# Patient Record
Sex: Male | Born: 1975 | Race: White | Hispanic: No | Smoking: Former smoker
Health system: Southern US, Community
[De-identification: ages and names within clinical notes are randomized; demographics above are authoritative.]

## PROBLEM LIST (undated history)

## (undated) DIAGNOSIS — E162 Hypoglycemia, unspecified: Secondary | ICD-10-CM

## (undated) DIAGNOSIS — G8929 Other chronic pain: Secondary | ICD-10-CM

## (undated) DIAGNOSIS — M1990 Unspecified osteoarthritis, unspecified site: Secondary | ICD-10-CM

## (undated) DIAGNOSIS — J449 Chronic obstructive pulmonary disease, unspecified: Secondary | ICD-10-CM

## (undated) DIAGNOSIS — E119 Type 2 diabetes mellitus without complications: Secondary | ICD-10-CM

## (undated) DIAGNOSIS — I10 Essential (primary) hypertension: Secondary | ICD-10-CM

## (undated) DIAGNOSIS — G4730 Sleep apnea, unspecified: Secondary | ICD-10-CM

## (undated) DIAGNOSIS — F32A Depression, unspecified: Secondary | ICD-10-CM

## (undated) DIAGNOSIS — F329 Major depressive disorder, single episode, unspecified: Secondary | ICD-10-CM

## (undated) DIAGNOSIS — F419 Anxiety disorder, unspecified: Secondary | ICD-10-CM

## (undated) DIAGNOSIS — F17200 Nicotine dependence, unspecified, uncomplicated: Secondary | ICD-10-CM

## (undated) DIAGNOSIS — F1120 Opioid dependence, uncomplicated: Secondary | ICD-10-CM

## (undated) HISTORY — DX: Hypoglycemia, unspecified: E16.2

## (undated) HISTORY — DX: Essential (primary) hypertension: I10

## (undated) HISTORY — PX: BACK SURGERY: SHX140

## (undated) HISTORY — PX: EYE SURGERY: SHX253

---
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
## 2000-04-17 ENCOUNTER — Encounter: Admission: RE | Admit: 2000-04-17 | Discharge: 2000-07-16 | Payer: Self-pay | Admitting: Family Medicine

## 2003-05-22 ENCOUNTER — Encounter: Payer: Self-pay | Admitting: Family Medicine

## 2003-05-22 ENCOUNTER — Ambulatory Visit (HOSPITAL_COMMUNITY): Admission: RE | Admit: 2003-05-22 | Discharge: 2003-05-22 | Payer: Self-pay | Admitting: Family Medicine

## 2004-09-30 ENCOUNTER — Ambulatory Visit: Payer: Self-pay | Admitting: Family Medicine

## 2006-01-15 ENCOUNTER — Ambulatory Visit: Payer: Self-pay | Admitting: Family Medicine

## 2006-01-26 ENCOUNTER — Ambulatory Visit: Payer: Self-pay | Admitting: Family Medicine

## 2006-02-12 ENCOUNTER — Ambulatory Visit: Payer: Self-pay | Admitting: Family Medicine

## 2006-06-21 ENCOUNTER — Emergency Department (HOSPITAL_COMMUNITY): Admission: EM | Admit: 2006-06-21 | Discharge: 2006-06-21 | Payer: Self-pay | Admitting: Emergency Medicine

## 2006-06-26 ENCOUNTER — Encounter: Admission: RE | Admit: 2006-06-26 | Discharge: 2006-06-26 | Payer: Self-pay | Admitting: Occupational Medicine

## 2006-12-02 ENCOUNTER — Ambulatory Visit (HOSPITAL_COMMUNITY): Admission: RE | Admit: 2006-12-02 | Discharge: 2006-12-03 | Payer: Self-pay | Admitting: Orthopedic Surgery

## 2008-06-09 ENCOUNTER — Emergency Department (HOSPITAL_COMMUNITY): Admission: EM | Admit: 2008-06-09 | Discharge: 2008-06-09 | Payer: Self-pay | Admitting: Emergency Medicine

## 2008-10-13 ENCOUNTER — Emergency Department (HOSPITAL_COMMUNITY): Admission: EM | Admit: 2008-10-13 | Discharge: 2008-10-13 | Payer: Self-pay | Admitting: Emergency Medicine

## 2008-11-16 ENCOUNTER — Emergency Department (HOSPITAL_COMMUNITY): Admission: EM | Admit: 2008-11-16 | Discharge: 2008-11-16 | Payer: Self-pay | Admitting: Emergency Medicine

## 2008-12-07 ENCOUNTER — Emergency Department (HOSPITAL_COMMUNITY): Admission: EM | Admit: 2008-12-07 | Discharge: 2008-12-07 | Payer: Self-pay | Admitting: Emergency Medicine

## 2009-03-19 ENCOUNTER — Encounter
Admission: RE | Admit: 2009-03-19 | Discharge: 2009-03-19 | Payer: Self-pay | Admitting: Physical Medicine & Rehabilitation

## 2009-08-02 ENCOUNTER — Emergency Department (HOSPITAL_COMMUNITY): Admission: EM | Admit: 2009-08-02 | Discharge: 2009-08-02 | Payer: Self-pay | Admitting: Emergency Medicine

## 2010-06-24 ENCOUNTER — Ambulatory Visit (HOSPITAL_COMMUNITY): Admission: RE | Admit: 2010-06-24 | Discharge: 2010-06-24 | Payer: Self-pay | Admitting: Neurology

## 2010-12-01 ENCOUNTER — Encounter: Payer: Self-pay | Admitting: Family Medicine

## 2010-12-03 ENCOUNTER — Ambulatory Visit: Admit: 2010-12-03 | Payer: Self-pay | Admitting: Orthopedic Surgery

## 2010-12-03 ENCOUNTER — Encounter: Payer: Self-pay | Admitting: Orthopedic Surgery

## 2010-12-17 ENCOUNTER — Encounter: Payer: Self-pay | Admitting: Orthopedic Surgery

## 2010-12-18 ENCOUNTER — Encounter: Payer: Self-pay | Admitting: Orthopedic Surgery

## 2010-12-18 ENCOUNTER — Ambulatory Visit (INDEPENDENT_AMBULATORY_CARE_PROVIDER_SITE_OTHER): Payer: Medicare Other | Admitting: Orthopedic Surgery

## 2010-12-18 DIAGNOSIS — M7710 Lateral epicondylitis, unspecified elbow: Secondary | ICD-10-CM

## 2010-12-18 DIAGNOSIS — L600 Ingrowing nail: Secondary | ICD-10-CM | POA: Insufficient documentation

## 2010-12-18 DIAGNOSIS — M7580 Other shoulder lesions, unspecified shoulder: Secondary | ICD-10-CM

## 2010-12-18 DIAGNOSIS — M25519 Pain in unspecified shoulder: Secondary | ICD-10-CM | POA: Insufficient documentation

## 2010-12-19 ENCOUNTER — Encounter: Payer: Self-pay | Admitting: Orthopedic Surgery

## 2010-12-26 NOTE — Letter (Signed)
Summary: *Orthopedic Consult Note  [facility]  [address]  [place], [place] [address]   Phone: [phone]  Fax: [phone]    Re:    SAYF KERNER DOB:    [birth year]   Dear: Darleen Crocker   Thank you for requesting that we see the above patient for consultation.  A copy of the detailed office note will be sent under separate cover, for your review.  Evaluation today is consistent with: RIGHT shoulder pain from impingement syndrome, lateral epicondylitis RIGHT elbow.   Our recommendation is for: subacromial injection, exercise program for the shoulder and elbow       Thank you for this opportunity to look after your patient.  Sincerely,   Terrance Mass. MD.

## 2010-12-26 NOTE — Assessment & Plan Note (Signed)
Summary: rt shoulder pain radiat'g to elbow /needs xr [place]/mcr/c...   Vital Signs:  Patient profile:   35 year old male Height:      69 inches Weight:      225 pounds Pulse rate:   78 / minute Resp:     16 per minute  Vitals Entered By: Fuller Canada MD (December 18, 2010 3:09 PM)  Visit Type:  NEW PATIENT Referring Provider:  DR. Gerilyn Pilgrim Primary Provider:  [facility]  CC:  right shoulder pain.  History of Present Illness:   35 year old male with chronic pain seen for chronic pain management here today for evaluation of RIGHT shoulder and RIGHT elbow.  He complains of sharp throbbing burning RIGHT shoulder pain over the deltoid.  His pain is 7/10 in intensity and it is constant worse with any range of motion and associated with some numbness and tingling as well as loss of grip power.  He relates that to his chronic C5-C6 disc disease.    MEDS: PERCOCET 7.5/325MG , ROBAXIN 750MG , NEURONTIN 600MG , ZOLOFT 100MG .    Allergies (verified): 1)  ! Pcn  Past History:  Past Medical History: BACK PROBLEMS NECK PROBLEMS  Past Surgical History: BACK NOSE EYE  Family History: Family History of Diabetes Family History Coronary Heart Disease male < 86 Family History of Arthritis Hx, family, asthma  Social History: Patient is separated.  UNEMPLOYED SMOKES 1PPD NO ALCOHOL CAFFEINE USE DAILY 10TH GRADE ED  Review of Systems Constitutional:  Complains of weight gain; denies weight loss, fever, chills, and fatigue. Cardiovascular:  Complains of chest pain and palpitations; denies fainting and murmurs. Respiratory:  Complains of snoring; denies short of breath, wheezing, couch, tightness, pain on inspiration, and snoring . Gastrointestinal:  Complains of heartburn; denies nausea, vomiting, diarrhea, constipation, and blood in your stools. Genitourinary:  Complains of bleeding in urine; denies frequency, urgency, difficulty urinating, painful urination, and  flank pain. Neurologic:  Complains of numbness and tingling; denies unsteady gait, dizziness, tremors, and seizure. Musculoskeletal:  Complains of joint pain and stiffness; denies swelling, instability, redness, heat, and muscle pain. Endocrine:  Complains of heat or cold intolerance; denies excessive thirst and exessive urination. Psychiatric:  Complains of nervousness, depression, and anxiety; denies hallucinations. HEENT:  Complains of redness and watering; denies blurred or double vision and eye pain. Immunology:  Complains of seasonal allergies; denies sinus problems and allergic to bee stings.  The review of systems is negative for Skin and Hemoatologic.  Physical Exam  Skin:  intact without lesions or rashes Cervical Nodes:  no significant adenopathy Axillary Nodes:  no significant adenopathy Psych:  alert and cooperative; normal mood and affect; normal attention span and concentration Additional Exam:  RIGHT upper extremity examination  RIGHT elbow  He has tenderness over the lateral epicondyles no swelling.  He has normal range of motion.  He has normal flexion extension power and his elbow is stable.  He does have pain with wrist extension vs. resistance  RIGHT shoulder  There is no tenderness around the shoulder.  He has normal passive range of motion but painful passive range of motion in the arc of 120-180 .  He has a positive impingement sign and weakness of his supraspinatus tendon.  He shoulder remain stable.     Shoulder/Elbow Exam  General:    Well-developed, well-nourished, normal body habitus; no deformities, normal grooming.    Vascular:    Radial, ulnar, brachial, and axillary pulses 2+ and symmetric; capillary refill less than  2 seconds; no evidence of ischemia, clubbing, or cyanosis.    Sensory:    Gross sensation intact in the upper extremities.    Motor:      Otherwise normal RIGHT upper extremity.decreased R-supraspinatus.    Reflexes:     Normal reflexes in the upper extremities.     Impression & Recommendations:  Problem # 1:  SHOULDER PAIN (ICD-719.41) Assessment New x-rays of the shoulder were normal  We did give him a subacromial injection  He does not appear to need any surgery just in rehabilitation with an exercise program.  Problem # 2:  IMPINGEMENT SYNDROME (ICD-726.2) Assessment: New  Orders: New Patient Level III (08657) Shoulder x-ray,  minimum 2 views (84696) Joint Aspirate / Injection, Large (20610) Depo- Medrol 40mg  (J1030)  Problem # 3:  LATERAL EPICONDYLITIS (ICD-726.32) Assessment: New  recommend exercises and tennis elbow brace  Orders: New Patient Level III (29528)  Patient Instructions: 1)  TENNIS ELBOW  2)  ROTATOR CUFF SYNDROME  3)  You have received an injection of cortisone today. You may experience increased pain at the injection site. Apply ice pack to the area for 20 minutes every 2 hours and take 2 xtra strength tylenol every 8 hours. This increased pain will usually resolve in 24 hours. The injection will take effect in 3-10 days.  4)  Limit activity to comfort and avoid activities that increase discomfort.  Apply moist heat and/or ice to shoulder and take medication as instructed for pain relief. Please read the Shoulder Pain Handout and start Home exercises  as directed.   Orders Added: 1)  New Patient Level III [99203] 2)  Shoulder x-ray,  minimum 2 views [73030] 3)  Joint Aspirate / Injection, Large [20610] 4)  Depo- Medrol 40mg  [J1030]  Appended Document: rt shoulder pain radiat'g to elbow /needs xr [place]/mcr/c... Separate and Identifiable X-Ray report      AP lateral RIGHT shoulder for shoulder pain  Normal glenohumeral joint, type 1/2 acromion.  Impression normal shoulder

## 2011-01-01 NOTE — Medication Information (Signed)
Summary: Tax adviser   Imported By: Cammie Sickle 12/25/2010 12:52:07  _____________________________________________________________________  External Attachment:    Type:   Image     Comment:   External Document

## 2011-01-07 NOTE — Letter (Signed)
Summary: History form  History form   Imported By: Jacklynn Ganong 12/31/2010 14:00:45  _____________________________________________________________________  External Attachment:    Type:   Image     Comment:   External Document

## 2011-01-07 NOTE — Letter (Signed)
Summary: Referral notes  Referral notes   Imported By: Jacklynn Ganong 12/31/2010 14:01:24  _____________________________________________________________________  External Attachment:    Type:   Image     Comment:   External Document

## 2011-02-24 LAB — URINE MICROSCOPIC-ADD ON: Urine-Other: NONE SEEN

## 2011-02-24 LAB — CBC
Hemoglobin: 15.9 g/dL (ref 13.0–17.0)
MCHC: 33.9 g/dL (ref 30.0–36.0)
RBC: 5.33 MIL/uL (ref 4.22–5.81)

## 2011-02-24 LAB — COMPREHENSIVE METABOLIC PANEL
ALT: 42 U/L (ref 0–53)
Alkaline Phosphatase: 81 U/L (ref 39–117)
CO2: 27 mEq/L (ref 19–32)
Calcium: 9.1 mg/dL (ref 8.4–10.5)
GFR calc non Af Amer: >60 mL/min (ref >60)
Glucose, Bld: 101 mg/dL — ABNORMAL HIGH (ref 70–99)
Sodium: 136 mEq/L (ref 135–145)

## 2011-02-24 LAB — DIFFERENTIAL
Basophils Absolute: 0.1 10*3/uL (ref 0.0–0.1)
Basophils Relative: 1 % (ref 0–1)
Eosinophils Absolute: 0.4 10*3/uL (ref 0.0–0.7)
Lymphs Abs: 2.2 10*3/uL (ref 0.7–4.0)
Neutrophils Relative %: 62 % (ref 43–77)

## 2011-02-24 LAB — URINALYSIS, ROUTINE W REFLEX MICROSCOPIC
Leukocytes, UA: NEGATIVE
Nitrite: NEGATIVE
Protein, ur: NEGATIVE mg/dL
Urobilinogen, UA: 0.2 mg/dL (ref 0.0–1.0)

## 2011-03-28 NOTE — Op Note (Signed)
NAMEALEXIUS, [name]              ACCOUNT NO.:  [id number]   MEDICAL RECORD NO.:  [id number]          PATIENT TYPE:  AMB   LOCATION:  DAY                          FACILITY:  [facility]   PHYSICIAN:  Ronald A. Gioffre, M.D.DATE OF BIRTH:  12-04-1975   DATE OF PROCEDURE:  12/02/2006  DATE OF DISCHARGE:                               OPERATIVE REPORT   SURGEON:  Georges Lynch. Darrelyn Hillock, M.D.   ASSISTANT:  Jene Every, MD   PREOPERATIVE DIAGNOSIS:  Herniated lumbar disk L5-S1 central to the  left.   POSTOPERATIVE DIAGNOSIS:  Herniated lumbar disk L5-S1 central to the  left.   NOTE:  The patient had all left leg pain preop.   OPERATION:  1. Hemilaminectomy L5-S1 on the left.  2. Microdiskectomy L5-S1 left.  3. Foraminotomy L5-S1 on the left.   PROCEDURE:  Under general anesthesia, routine orthopedic prep and  draping of the back carried out.  The patient had 1 gram of IV Ancef  preop.  At this time two needles were placed in the back for  localization purposes after the sterile prep was carried out.  X-ray was  taken to verify our position.  Following that incision was made over the  L5-S1 interspace.  Another x-ray was taken to verify our exact position  at L5-S1 left.  Following that the incision was made over the L5-S1  interspace.  Bleeders identified and cauterized.  The muscle was  stripped from the lamina and spinous process at L5-S1 to identify the  sacrum.  Another x-ray was taken to verify our exact position.  I then  did a hemilaminectomy in usual fashion at L5-S1.  I removed a portion of  the ligamentum flavum out laterally into the lateral recess and then  cauterized lateral recess veins.  We gently retracted the dura and the  nerve with a D'Errico retractor.  We did a nice foraminotomy as well.  Following that, we identified the herniated disk.  Cruciate incision was  made the posterior longitudinal ligament and a diskectomy was carried  out.  We made multiple passes into  the space.  We utilized the Epstein  curettes to compress the disk down in the space and then remove the disk  material.  We explored the area above with a hockey-stick and it was  nice and free.  The root now and the dura now was easily movable and  free.  There were no other abnormalities in the canal.  I thoroughly  irrigated out the area, I loosely applied some thrombin-soaked Gelfoam  for hemostasis and the wound was closed in layers in usual fashion  except the deep proximal part of the wound was partially left open for  drainage purposes.  Sterile dry dressings were applied.  The patient had  30 mg of Toradol IV before leaving the operating room and prior to  surgery 1 gram of IV Ancef.           ______________________________  Georges Lynch. Darrelyn Hillock, M.D.    RAG/MEDQ  D:  12/02/2006  T:  12/02/2006  Job:  [id number]

## 2011-04-21 ENCOUNTER — Other Ambulatory Visit: Payer: Self-pay | Admitting: Neurology

## 2011-04-21 DIAGNOSIS — M5412 Radiculopathy, cervical region: Secondary | ICD-10-CM

## 2011-04-24 ENCOUNTER — Other Ambulatory Visit: Payer: Self-pay | Admitting: Neurology

## 2011-04-24 DIAGNOSIS — M5412 Radiculopathy, cervical region: Secondary | ICD-10-CM

## 2011-04-24 DIAGNOSIS — M75100 Unspecified rotator cuff tear or rupture of unspecified shoulder, not specified as traumatic: Secondary | ICD-10-CM

## 2011-04-25 ENCOUNTER — Ambulatory Visit (HOSPITAL_COMMUNITY): Payer: Medicare Other

## 2011-04-28 ENCOUNTER — Ambulatory Visit (HOSPITAL_COMMUNITY)
Admission: RE | Admit: 2011-04-28 | Discharge: 2011-04-28 | Disposition: A | Discharge status: Home / Self Care | Payer: Medicare Other | Source: Ambulatory Visit | Attending: Neurology | Admitting: Neurology

## 2011-04-28 DIAGNOSIS — M542 Cervicalgia: Secondary | ICD-10-CM | POA: Insufficient documentation

## 2011-04-28 DIAGNOSIS — M75100 Unspecified rotator cuff tear or rupture of unspecified shoulder, not specified as traumatic: Secondary | ICD-10-CM

## 2011-04-28 DIAGNOSIS — M5412 Radiculopathy, cervical region: Secondary | ICD-10-CM

## 2011-04-28 DIAGNOSIS — M25519 Pain in unspecified shoulder: Secondary | ICD-10-CM | POA: Insufficient documentation

## 2011-04-30 ENCOUNTER — Ambulatory Visit (INDEPENDENT_AMBULATORY_CARE_PROVIDER_SITE_OTHER): Payer: Medicare Other | Admitting: Orthopedic Surgery

## 2011-04-30 ENCOUNTER — Encounter: Payer: Self-pay | Admitting: Orthopedic Surgery

## 2011-04-30 DIAGNOSIS — M25519 Pain in unspecified shoulder: Secondary | ICD-10-CM

## 2011-04-30 DIAGNOSIS — M4802 Spinal stenosis, cervical region: Secondary | ICD-10-CM

## 2011-04-30 NOTE — Progress Notes (Signed)
   Followup visit  Patient went to ER for severe RIGHT shoulder pain  Patient had MRI cervical spine and MRI RIGHT shoulder on June 18  MRI of the shoulder shows no intra-articular abnormalities and a normal rotator cuff and biceps tendon  MRI of the cervical spine shows a multilevel foraminal stenosis secondary to spondylosis most significant at the C3-C4 and C6-C7 levels on the RIGHT with a small disc protrusion at C5-C6 and C6-C7.  Reexamination of the RIGHT shoulder reveals no impingement and no instability.  Recommend neurosurgical followup  Diagnosis spinal stenosis.  Cervical.

## 2011-04-30 NOTE — Patient Instructions (Signed)
Neurosurgery referral for C spine spinal stenosis

## 2011-05-02 ENCOUNTER — Telehealth: Payer: Self-pay | Admitting: Radiology

## 2011-05-02 NOTE — Telephone Encounter (Signed)
I faxed a referral for the patient to [facility] to be seen for spinal stenosis of his C-spine.

## 2011-05-06 ENCOUNTER — Telehealth: Payer: Self-pay | Admitting: Orthopedic Surgery

## 2011-05-06 NOTE — Telephone Encounter (Addendum)
I called patient at ph [phone] and relayed referral appointment information.

## 2011-05-06 NOTE — Telephone Encounter (Signed)
Received call from [facility] at [facility] for patient's referral appointment. Patient is scheduled for appointment Thursday, 05/22/11, 2:30 pm - arrival time: 2:00 pm; patient is to bring insurance card, photo ID, and a list of medications; states no need to bring MRI film, as they can pull it from [facility] system.

## 2011-06-26 ENCOUNTER — Encounter (HOSPITAL_COMMUNITY)
Admission: RE | Admit: 2011-06-26 | Discharge: 2011-06-26 | Disposition: A | Discharge status: Home / Self Care | Payer: Medicare Other | Source: Ambulatory Visit | Attending: Neurosurgery | Admitting: Neurosurgery

## 2011-06-26 ENCOUNTER — Ambulatory Visit (HOSPITAL_COMMUNITY)
Admission: RE | Admit: 2011-06-26 | Discharge: 2011-06-26 | Disposition: A | Discharge status: Home / Self Care | Payer: Medicare Other | Source: Ambulatory Visit | Attending: Neurosurgery | Admitting: Neurosurgery

## 2011-06-26 ENCOUNTER — Other Ambulatory Visit (HOSPITAL_COMMUNITY): Payer: Self-pay | Admitting: Neurosurgery

## 2011-06-26 DIAGNOSIS — Z01812 Encounter for preprocedural laboratory examination: Secondary | ICD-10-CM | POA: Insufficient documentation

## 2011-06-26 DIAGNOSIS — I10 Essential (primary) hypertension: Secondary | ICD-10-CM | POA: Insufficient documentation

## 2011-06-26 DIAGNOSIS — J45909 Unspecified asthma, uncomplicated: Secondary | ICD-10-CM | POA: Insufficient documentation

## 2011-06-26 DIAGNOSIS — M4722 Other spondylosis with radiculopathy, cervical region: Secondary | ICD-10-CM

## 2011-06-26 DIAGNOSIS — Z87891 Personal history of nicotine dependence: Secondary | ICD-10-CM | POA: Insufficient documentation

## 2011-06-26 DIAGNOSIS — Z01810 Encounter for preprocedural cardiovascular examination: Secondary | ICD-10-CM | POA: Insufficient documentation

## 2011-06-26 DIAGNOSIS — Z01818 Encounter for other preprocedural examination: Secondary | ICD-10-CM | POA: Insufficient documentation

## 2011-06-26 LAB — SURGICAL PCR SCREEN
MRSA, PCR: NEGATIVE
Staphylococcus aureus: NEGATIVE

## 2011-06-26 LAB — BASIC METABOLIC PANEL
Calcium: 9.7 mg/dL (ref 8.4–10.5)
Creatinine, Ser: 0.82 mg/dL (ref 0.50–1.35)
GFR calc non Af Amer: >60 mL/min (ref >60)
Glucose, Bld: 82 mg/dL (ref 70–99)
Sodium: 139 mEq/L (ref 135–145)

## 2011-06-26 LAB — CBC
MCH: 30.2 pg (ref 26.0–34.0)
MCHC: 35 g/dL (ref 30.0–36.0)
Platelets: 227 10*3/uL (ref 150–400)

## 2011-07-08 ENCOUNTER — Inpatient Hospital Stay (HOSPITAL_COMMUNITY)
Admission: RE | Admit: 2011-07-08 | Discharge: 2011-07-12 | DRG: 473 | Disposition: A | Discharge status: Home / Self Care | Payer: Medicare Other | Source: Ambulatory Visit | Attending: Neurosurgery | Admitting: Neurosurgery

## 2011-07-08 ENCOUNTER — Inpatient Hospital Stay (HOSPITAL_COMMUNITY): Payer: Medicare Other

## 2011-07-08 DIAGNOSIS — I10 Essential (primary) hypertension: Secondary | ICD-10-CM | POA: Diagnosis present

## 2011-07-08 DIAGNOSIS — J45909 Unspecified asthma, uncomplicated: Secondary | ICD-10-CM | POA: Diagnosis present

## 2011-07-08 DIAGNOSIS — M5030 Other cervical disc degeneration, unspecified cervical region: Principal | ICD-10-CM | POA: Diagnosis present

## 2011-07-08 DIAGNOSIS — F411 Generalized anxiety disorder: Secondary | ICD-10-CM | POA: Diagnosis present

## 2011-07-08 DIAGNOSIS — K219 Gastro-esophageal reflux disease without esophagitis: Secondary | ICD-10-CM | POA: Diagnosis present

## 2011-07-08 DIAGNOSIS — F17200 Nicotine dependence, unspecified, uncomplicated: Secondary | ICD-10-CM | POA: Diagnosis present

## 2011-07-09 NOTE — Op Note (Signed)
[name], Raymond NO.:  [id number]  MEDICAL RECORD NO.:  [id number]  LOCATION:  3106                         FACILITY:  [facility]  PHYSICIAN:  [name], M.D.   DATE OF BIRTH:  04/06/1976  DATE OF PROCEDURE:  07/08/2011 DATE OF DISCHARGE:  06/26/2011                              OPERATIVE REPORT   PREOPERATIVE DIAGNOSES:  Cervical stenosis C3-4, 4-5, 5-6, 6-7 with chronic radiculopathy, morbid obesity.  POSTOPERATIVE DIAGNOSES:  Cervical stenosis C3-4, 4-5, 5-6, 6-7 with chronic radiculopathy, morbid obesity.  PROCEDURE:  Anterior C3-4, 4-5, 5-6, 6-7 diskectomy, decompression of spinal cord, bilateral foraminotomy, interbody fusion with allograft, 6 mm _lordoticpost ligament_________ at the level of C3-C4 and 7 mm lordotic at the other 3 levels, microscope.  SURGEON:  Raymond Lias, MD  ASSISTANT:  [name], M.D.  CLINICAL HISTORY:  [name] is a 35 year old gentleman, unemployed, who had surgery DONE IN THE__________lower back for many years.  Now, he comes complaining for weakness, neck pain, and x-ray shows stenosis from C3 down to C6-7.  The patient is quite obese.  The patient knows the risk of the surgery including that the surgery will not correct his chronic lumbar problem.  PROCEDURE:  The patient was taken to the OR and after intubation, the left side of the neck was cleaned with DuraPrep.  Drapes were applied. The patient had quite a bit of short neck.  He was difficult to feel any anatomy.  Nevertheless, after the DuraPrep was tried, a longitudinal incision was made through the skin subcutaneous tissue.  Dissection was carried out, all the way down to the cervical spine. The patient had quite a bit of fat tissue, and it was difficult to keep the retraction to visualize the cervical spine.  We had to use a cerebellar retractor just to keep the skin folds away from the surgical procedure.  X-rays showed that we were at  the level of C3-C4, C4-C5.  Then, the anterior osteophyte was removed across 2 levels as well at C5-6 and 6-7.  We brought the microscope and we debrided with drilling of posterior ligament__________ of C3- 4.  The patient has quite bit of degenerative disk disease with stenosis.  The posterior ligament was shortened and decompression of the spinal cord was achieved as well as the both C4 nerve roots.  At the level of C4-C5, he has quite a bit of calcification of the posterior ligament.  We have to drill the posterior ligament just to be able to get into the dura mater.  At the end the dura mater was decompressed with plenty__________ of the C5 nerve root.  The same procedure was done at the level of C5-6, 6-7.  At the end, we have good decompression of the spinal cord, as well as the C4-C5, C6-C7 nerve root.  The endplate was drilled.  An allograft of 6 mm lordotic was inset at the level of C3-C4 and the other 3 levels was 7 mm.  This was followed by plate using 10 screws.  Although, we achieved good hemostasis, we used Surgifoam as well as we left drain in the precervical area.  From then the wound was closed and  Steri-Strips were used for skin.  Vicryl was used for soft tissue.  The patient was stable, because of his obesity problem, he has sleep apnea.  He is going to go to the Intensive Care Unit.          ______________________________ [name], M.D.     EB/MEDQ  D:  07/08/2011  T:  07/09/2011  Job:  [id number]  Electronically Signed by [name] M.D. on 07/09/2011 05:05:37 PM

## 2011-07-11 NOTE — H&P (Signed)
  [name], [name]              ACCOUNT NO.:  [id number]  MEDICAL RECORD NO.:  [id number]  LOCATION:  3106                         FACILITY:  [facility]  PHYSICIAN:  Hilda Lias, M.D.   DATE OF BIRTH:  July 04, 1976  DATE OF ADMISSION:  07/08/2011 DATE OF DISCHARGE:                             HISTORY & PHYSICAL   [name] is a gentleman who has been seen in my office  because of neck pain with radiation to both upper extremities associated with weakness.  He feels every time he moves the pain gets worse.  The pain is also associated with a throbbing sensation.  PAST MEDICAL HISTORY:  Lumbar discectomy with Orthopedic surgeon.  He has had chronic back pain, followed by a Pain Clinic.  ALLERGIES:  He tells me he is allergic to PENICILLIN and ERYTHROMYCIN.  At the present time, he is taking oxycodone metoprolol and  Zoloft.  FAMILY HISTORY:  Mother has heart disease and she is 18.  Father has diabetes and high blood pressure.  SOCIAL HISTORY:  He does not drink.  He does smoke a pack of cigarettes a day.  PHYSICAL EXAMINATION:  neck  decrease of mobility secondary to pain lungs  bilateral ronchis cvs normal   abdomen normal     ext normal neuro  weakness of deltouid bicepes decreaase of reflexes. burning sensation in arms.mri stenosis cervial c3 c7 impresion  cervical stenosis c3 to c7 EB/MEDQ  D:  07/08/2011  T:  07/08/2011  Job:  [id number]  Electronically Signed by Hilda Lias M.D. on 07/11/2011 06:29:35 PM

## 2011-08-15 LAB — POCT CARDIAC MARKERS
CKMB, poc: <1 ng/mL — ABNORMAL LOW (ref 1.0–8.0)
Troponin i, poc: <0.05 ng/mL (ref 0.00–0.09)

## 2011-08-15 LAB — BASIC METABOLIC PANEL
BUN: 14 mg/dL (ref 6–23)
Chloride: 105 mEq/L (ref 96–112)
Creatinine, Ser: 0.75 mg/dL (ref 0.4–1.5)
Glucose, Bld: 81 mg/dL (ref 70–99)

## 2011-08-15 LAB — CBC
MCV: 88.2 fL (ref 78.0–100.0)
Platelets: 207 10*3/uL (ref 150–400)
RDW: 12.7 % (ref 11.5–15.5)
WBC: 11.7 10*3/uL — ABNORMAL HIGH (ref 4.0–10.5)

## 2011-08-15 LAB — DIFFERENTIAL
Basophils Absolute: 0.1 10*3/uL (ref 0.0–0.1)
Basophils Relative: 1 % (ref 0–1)
Eosinophils Absolute: 0.4 10*3/uL (ref 0.0–0.7)
Eosinophils Relative: 4 % (ref 0–5)
Lymphs Abs: 1.4 10*3/uL (ref 0.7–4.0)
Neutrophils Relative %: 74 % (ref 43–77)

## 2011-08-15 NOTE — Discharge Summary (Signed)
  NAMETHUNDER, BRIDGEWATER              ACCOUNT NO.:  [id number]  MEDICAL RECORD NO.:  [id number]  LOCATION:  3037                         FACILITY:  [facility]  PHYSICIAN:  Hilda Lias, M.D.   DATE OF BIRTH:  1976-08-16  DATE OF ADMISSION:  07/08/2011 DATE OF DISCHARGE:  07/12/2011                              DISCHARGE SUMMARY   ADMISSION DIAGNOSES:  Cervical stenosis with spondylosis and radiculopathy, cervical 3-4, cervical 4-5, 5-6, 6-7.  FINAL DIAGNOSES:  Cervical stenosis with spondylosis and radiculopathy, cervical 3-4, cervical 4-5, 5-6, 6-7.  CLINICAL HISTORY:  [name] was admitted because of neck pain with radiation to both upper extremity associated with weakness.  X-ray shows stenosis from C3 down to C7.  Surgery was advised.  Laboratory normal.  COURSE IN THE HOSPITAL:  The patient was taken to surgery on July 08, 2011, and decompression from cervical 3-7 was done.  The patient did well, complains of neck pain.  Eventually the pain got better with medication.  He was discharged by Dr. Marikay Alar on July 12, 2011.  CONDITION ON DISCHARGE:  Improvement.  MEDICATION:  Percocet, diazepam.  DIET:  Regular.  ACTIVITY:  Not to drive for at least a week.  FOLLOWUP:  He was told to be seen by me in 2 to 3 weeks.          ______________________________ Hilda Lias, M.D.     EB/MEDQ  D:  07/23/2011  T:  07/24/2011  Job:  [id number]  Electronically Signed by Hilda Lias M.D. on 08/15/2011 06:13:10 PM

## 2011-12-16 ENCOUNTER — Other Ambulatory Visit: Payer: Self-pay | Admitting: Anesthesiology

## 2011-12-16 DIAGNOSIS — M545 Low back pain: Secondary | ICD-10-CM

## 2011-12-23 ENCOUNTER — Inpatient Hospital Stay: Admission: RE | Admit: 2011-12-23 | Payer: Medicare Other | Source: Ambulatory Visit

## 2011-12-30 ENCOUNTER — Inpatient Hospital Stay: Admission: RE | Admit: 2011-12-30 | Payer: Medicare Other | Source: Ambulatory Visit

## 2012-02-25 ENCOUNTER — Ambulatory Visit
Admission: RE | Admit: 2012-02-25 | Discharge: 2012-02-25 | Disposition: A | Discharge status: Home / Self Care | Payer: Medicare Other | Source: Ambulatory Visit | Attending: Anesthesiology | Admitting: Anesthesiology

## 2012-02-25 DIAGNOSIS — M545 Low back pain: Secondary | ICD-10-CM

## 2013-05-19 DIAGNOSIS — M1990 Unspecified osteoarthritis, unspecified site: Secondary | ICD-10-CM | POA: Insufficient documentation

## 2013-10-31 DIAGNOSIS — I10 Essential (primary) hypertension: Secondary | ICD-10-CM | POA: Insufficient documentation

## 2013-10-31 DIAGNOSIS — M5417 Radiculopathy, lumbosacral region: Secondary | ICD-10-CM | POA: Insufficient documentation

## 2013-10-31 DIAGNOSIS — R252 Cramp and spasm: Secondary | ICD-10-CM | POA: Insufficient documentation

## 2013-10-31 DIAGNOSIS — M25559 Pain in unspecified hip: Secondary | ICD-10-CM | POA: Insufficient documentation

## 2013-10-31 DIAGNOSIS — M129 Arthropathy, unspecified: Secondary | ICD-10-CM | POA: Insufficient documentation

## 2014-01-31 DIAGNOSIS — M5412 Radiculopathy, cervical region: Secondary | ICD-10-CM | POA: Insufficient documentation

## 2014-04-17 DIAGNOSIS — M792 Neuralgia and neuritis, unspecified: Secondary | ICD-10-CM | POA: Insufficient documentation

## 2014-04-17 DIAGNOSIS — M47812 Spondylosis without myelopathy or radiculopathy, cervical region: Secondary | ICD-10-CM | POA: Insufficient documentation

## 2014-04-17 DIAGNOSIS — M5412 Radiculopathy, cervical region: Secondary | ICD-10-CM | POA: Insufficient documentation

## 2014-04-17 DIAGNOSIS — M5416 Radiculopathy, lumbar region: Secondary | ICD-10-CM | POA: Diagnosis present

## 2014-07-21 DIAGNOSIS — M5412 Radiculopathy, cervical region: Secondary | ICD-10-CM | POA: Insufficient documentation

## 2014-10-12 DIAGNOSIS — M5412 Radiculopathy, cervical region: Secondary | ICD-10-CM | POA: Insufficient documentation

## 2014-10-12 DIAGNOSIS — M542 Cervicalgia: Secondary | ICD-10-CM | POA: Insufficient documentation

## 2014-11-08 ENCOUNTER — Emergency Department (HOSPITAL_COMMUNITY): Payer: Medicare Other

## 2014-11-08 ENCOUNTER — Encounter (HOSPITAL_COMMUNITY): Payer: Self-pay | Admitting: Emergency Medicine

## 2014-11-08 ENCOUNTER — Emergency Department (HOSPITAL_COMMUNITY)
Admission: EM | Admit: 2014-11-08 | Discharge: 2014-11-08 | Disposition: A | Discharge status: Home / Self Care | Payer: Medicare Other | Attending: Emergency Medicine | Admitting: Emergency Medicine

## 2014-11-08 DIAGNOSIS — F419 Anxiety disorder, unspecified: Secondary | ICD-10-CM | POA: Insufficient documentation

## 2014-11-08 DIAGNOSIS — Z880 Allergy status to penicillin: Secondary | ICD-10-CM | POA: Insufficient documentation

## 2014-11-08 DIAGNOSIS — Z8639 Personal history of other endocrine, nutritional and metabolic disease: Secondary | ICD-10-CM | POA: Insufficient documentation

## 2014-11-08 DIAGNOSIS — R079 Chest pain, unspecified: Secondary | ICD-10-CM

## 2014-11-08 DIAGNOSIS — F985 Adult onset fluency disorder: Secondary | ICD-10-CM | POA: Diagnosis not present

## 2014-11-08 DIAGNOSIS — Z720 Tobacco use: Secondary | ICD-10-CM | POA: Diagnosis not present

## 2014-11-08 DIAGNOSIS — F8081 Childhood onset fluency disorder: Secondary | ICD-10-CM

## 2014-11-08 DIAGNOSIS — R479 Unspecified speech disturbances: Secondary | ICD-10-CM | POA: Diagnosis present

## 2014-11-08 DIAGNOSIS — Z79899 Other long term (current) drug therapy: Secondary | ICD-10-CM | POA: Insufficient documentation

## 2014-11-08 DIAGNOSIS — I10 Essential (primary) hypertension: Secondary | ICD-10-CM | POA: Diagnosis not present

## 2014-11-08 LAB — CBC WITH DIFFERENTIAL/PLATELET
Basophils Absolute: 0 10*3/uL (ref 0.0–0.1)
Basophils Relative: 1 % (ref 0–1)
EOS ABS: 0.2 10*3/uL (ref 0.0–0.7)
EOS PCT: 3 % (ref 0–5)
HEMATOCRIT: 41.4 % (ref 39.0–52.0)
HEMOGLOBIN: 14.1 g/dL (ref 13.0–17.0)
LYMPHS ABS: 1.6 10*3/uL (ref 0.7–4.0)
Lymphocytes Relative: 22 % (ref 12–46)
MCH: 29.6 pg (ref 26.0–34.0)
MCHC: 34.1 g/dL (ref 30.0–36.0)
MCV: 86.8 fL (ref 78.0–100.0)
MONO ABS: 0.8 10*3/uL (ref 0.1–1.0)
MONOS PCT: 11 % (ref 3–12)
Neutro Abs: 4.6 10*3/uL (ref 1.7–7.7)
Neutrophils Relative %: 63 % (ref 43–77)
Platelets: 228 10*3/uL (ref 150–400)
RBC: 4.77 MIL/uL (ref 4.22–5.81)
RDW: 12.5 % (ref 11.5–15.5)
WBC: 7.3 10*3/uL (ref 4.0–10.5)

## 2014-11-08 LAB — BASIC METABOLIC PANEL
Anion gap: 8 (ref 5–15)
BUN: 9 mg/dL (ref 6–23)
CHLORIDE: 105 meq/L (ref 96–112)
CO2: 23 mmol/L (ref 19–32)
CREATININE: 0.65 mg/dL (ref 0.50–1.35)
Calcium: 8.8 mg/dL (ref 8.4–10.5)
GFR calc Af Amer: >90 mL/min (ref >90)
GFR calc non Af Amer: >90 mL/min (ref >90)
Glucose, Bld: 93 mg/dL (ref 70–99)
Potassium: 3.7 mmol/L (ref 3.5–5.1)
Sodium: 136 mmol/L (ref 135–145)

## 2014-11-08 LAB — PROTIME-INR
INR: 1.02 (ref 0.00–1.49)
Prothrombin Time: 13.5 seconds (ref 11.6–15.2)

## 2014-11-08 LAB — TROPONIN I: Troponin I: <0.03 ng/mL (ref <0.031)

## 2014-11-08 MED ORDER — LORAZEPAM 1 MG PO TABS
1.0000 mg | ORAL_TABLET | Freq: Once | ORAL | Status: AC
  Administered 2014-11-08: 1 mg via ORAL
  Filled 2014-11-08: qty 1

## 2014-11-08 NOTE — ED Notes (Signed)
Pt ambulated around the unit with minimal assistance.

## 2014-11-08 NOTE — ED Notes (Signed)
Per EMS, pt comes from home with abnormal speech. Pt was last seen normal at 1743. Pt had gotten upset and sister noticed his speech changed at 1743. Pt A&OX4, NAD noted. Pt fell in shower and did not hit head. Initial BP 202/116, P86.

## 2014-11-08 NOTE — ED Notes (Signed)
Pt's speech clear.

## 2014-11-08 NOTE — Discharge Instructions (Signed)
Read the information below.  You may return to the Emergency Department at any time for worsening condition or any new symptoms that concern you.   If you develop worsening chest pain, shortness of breath, fever, you pass out, or become weak or dizzy, return to the ER for a recheck.    °

## 2014-11-08 NOTE — Consult Note (Signed)
Reason for Consult: New onset speech abnormality.  HPI:                                                                                                                                          [name] is an 38 y.o. male with a history of hypertension, hyperglycemia, chronic pain syndrome, anxiety and depression, presenting with new onset stuttering type speech abnormality. Onset was at 5:45 PM today and was associated with getting extremely angry with his daughter. Vision has had no difficulty with writing what he wants to say. He said no focal weakness. He complains of slight numbness involving right side of his face. CT scan of his head showed no acute intracranial abnormality. A pressure was elevated at 202/116 on arrival. NIH stroke or was 2 for speech flow abnormality and focal numbness involving right side of his face. Code stroke was activated but was subsequently canceled, as patient's findings were not considered psychophysiologic in etiology.  Past Medical History  Diagnosis Date  . High blood pressure   . Hypoglycemia     Past Surgical History  Procedure Laterality Date  . Back surgery    . Eye surgery      History reviewed. No pertinent family history.  Social History:  reports that he has been smoking Cigarettes.  He has been smoking about 2.00 packs per day. He does not have any smokeless tobacco history on file. He reports that he does not drink alcohol. His drug history is not on file.  Allergies  Allergen Reactions  . Penicillins     MEDICATIONS:                                                                                                                     I have reviewed the patient's current medications.   ROS:  History obtained from the patient  General ROS: negative for - chills, fatigue, fever, night sweats, weight  gain or weight loss Psychological ROS: negative for - behavioral disorder, hallucinations, memory difficulties, mood swings or suicidal ideation Ophthalmic ROS: negative for - blurry vision, double vision, eye pain or loss of vision ENT ROS: negative for - epistaxis, nasal discharge, oral lesions, sore throat, tinnitus or vertigo Allergy and Immunology ROS: negative for - hives or itchy/watery eyes Hematological and Lymphatic ROS: negative for - bleeding problems, bruising or swollen lymph nodes Endocrine ROS: negative for - galactorrhea, hair pattern changes, polydipsia/polyuria or temperature intolerance Respiratory ROS: negative for - cough, hemoptysis, shortness of breath or wheezing Cardiovascular ROS: negative for - chest pain, dyspnea on exertion, edema or irregular heartbeat Gastrointestinal ROS: negative for - abdominal pain, diarrhea, hematemesis, nausea/vomiting or stool incontinence Genito-Urinary ROS: negative for - dysuria, hematuria, incontinence or urinary frequency/urgency Musculoskeletal ROS: negative for - joint swelling or muscular weakness Neurological ROS: as noted in HPI Dermatological ROS: negative for rash and skin lesion changes   Blood pressure 135/73, pulse 77, temperature 98.5 F (36.9 C), temperature source Oral, resp. rate 20, height 5\' 9"  (1.753 m), weight 114.306 kg (252 lb), SpO2 99 %. Urines examined obese early middle-aged appearing man who was alert and in no acute distress. HEENT: Unremarkable Neck was supple with full range of motion. Extremities were normal in appearance.  Neurologic Examination:                                                                                                      Mental Status: Alert, oriented, thought content appropriate.  Inconsistent stuttering of speech without evidence of aphasia. Patient was able to write without difficulty. Able to follow commands without difficulty. Cranial Nerves: II-Visual fields were  normal. III/IV/VI-Pupils were equal and reacted. Extraocular movements were full and conjugate.    V/VII-slight right facial numbness compared to left side; no facial weakness. VIII-normal. X-no dysarthria; symmetrical palatal movement. Motor: 5/5 bilaterally with normal tone and bulk Sensory: Normal throughout. Deep Tendon Reflexes: 2+ and symmetric. Plantars: Flexor bilaterally Cerebellar: Normal finger-to-nose testing. Carotid auscultation: Normal  No results found for: CHOL  Results for orders placed or performed during the hospital encounter of 11/08/14 (from the past 48 hour(s))  CBC with Differential     Status: None   Collection Time: 11/08/14  7:51 PM  Result Value Ref Range   WBC 7.3 4.0 - 10.5 K/uL   RBC 4.77 4.22 - 5.81 MIL/uL   Hemoglobin 14.1 13.0 - 17.0 g/dL   HCT 41.4 39.0 - 52.0 %   MCV 86.8 78.0 - 100.0 fL   MCH 29.6 26.0 - 34.0 pg   MCHC 34.1 30.0 - 36.0 g/dL   RDW 12.5 11.5 - 15.5 %   Platelets 228 150 - 400 K/uL   Neutrophils Relative % 63 43 - 77 %   Neutro Abs 4.6 1.7 - 7.7 K/uL   Lymphocytes Relative 22 12 - 46 %   Lymphs Abs 1.6 0.7 - 4.0 K/uL   Monocytes Relative 11  3 - 12 %   Monocytes Absolute 0.8 0.1 - 1.0 K/uL   Eosinophils Relative 3 0 - 5 %   Eosinophils Absolute 0.2 0.0 - 0.7 K/uL   Basophils Relative 1 0 - 1 %   Basophils Absolute 0.0 0.0 - 0.1 K/uL  Protime-INR     Status: None   Collection Time: 11/08/14  7:51 PM  Result Value Ref Range   Prothrombin Time 13.5 11.6 - 15.2 seconds   INR 1.02 0.00 - 1.49    No results found.   Assessment/Plan: 38 year old man is entering with new onset stuttering speech pattern associated with acute agitation and stress. Pattern of speech abnormality is not consistent with expressive aphasia, and is is likely psychophysiologic in origin. Examination showed no objective abnormalities and CT scan of his head was unremarkable.  No further neurological intervention is indicated, including no further  neurodiagnostic studies. Hypertension to be managed by ED physician.  C.R. Nicole Kindred, MD [facility] [phone]  11/08/2014, 8:36 PM

## 2014-11-08 NOTE — ED Provider Notes (Signed)
CSN: [id number]     Arrival date & time 11/08/14  1906 History   First MD Initiated Contact with Patient 11/08/14 1911     Chief Complaint  Patient presents with  . abnormal speech      (Consider location/radiation/quality/duration/timing/severity/associated sxs/prior Treatment) The history is provided by the patient.     Patient with hx HTN, anxiety presents with stuttering speech that began around 1745 today.  States he had had a mild typical headache, frontal throbbing, mild around 1530 today.  His daughter then turned on some music and this made him very angry, 9/10 intensity angry.  He then attempted to talk on the phone with his friend and developed stuttering speech.  He is having trouble getting his words out, though he can think of what he wants to say and can understand what is said to him.  He is finding this very frustrating.  He also notes some central chest discomfort that feels like heartburn.   Past Medical History  Diagnosis Date  . High blood pressure   . Hypoglycemia    Past Surgical History  Procedure Laterality Date  . Back surgery    . Eye surgery     History reviewed. No pertinent family history. History  Substance Use Topics  . Smoking status: Current Every Day Smoker -- 2.00 packs/day    Types: Cigarettes  . Smokeless tobacco: Not on file  . Alcohol Use: No    Review of Systems  All other systems reviewed and are negative.     Allergies  Penicillins  Home Medications   Prior to Admission medications   Medication Sig Start Date End Date Taking? Authorizing Provider  cloNIDine (CATAPRES) 0.1 MG tablet Take 0.1 mg by mouth 3 (three) times daily.      Historical Provider, MD  gabapentin (NEURONTIN) 300 MG capsule Take 600 mg by mouth 3 (three) times daily.      Historical Provider, MD  lisinopril (PRINIVIL,ZESTRIL) 10 MG tablet Take 10 mg by mouth daily.      Historical Provider, MD  methocarbamol (ROBAXIN) 750 MG tablet Take 750 mg by mouth 3  (three) times daily.      Historical Provider, MD  oxyCODONE-acetaminophen (PERCOCET) 7.5-325 MG per tablet Take 1 tablet by mouth every 4 (four) hours as needed.      Historical Provider, MD  sertraline (ZOLOFT) 100 MG tablet Take 100 mg by mouth daily.      Historical Provider, MD  topiramate (TOPAMAX) 50 MG tablet Take 50 mg by mouth 2 (two) times daily.      Historical Provider, MD   BP 157/89 mmHg  Pulse 82  Temp(Src) 98.5 F (36.9 C) (Oral)  Resp 20  Ht 5\' 9"  (1.753 m)  Wt 252 lb (114.306 kg)  BMI 37.20 kg/m2  SpO2 95% Physical Exam  Constitutional: He appears well-developed and well-nourished. No distress.  HENT:  Head: Normocephalic and atraumatic.  Neck: Neck supple.  Cardiovascular: Normal rate and regular rhythm.   Pulmonary/Chest: Effort normal and breath sounds normal. No respiratory distress. He has no wheezes. He has no rales.  Abdominal: Soft. He exhibits no distension and no mass. There is no tenderness. There is no rebound and no guarding.  Neurological: He is alert. He exhibits normal muscle tone.  Stuttering speech.    CN II-XII intact with exception of abnormal sensation to right face, no pronator drift, grip strengths equal bilaterally; strength 5/5 in all extremities, sensation intact in all extremities; finger to nose,  heel to shin, rapid alternating movements normal.   Skin: He is not diaphoretic.  Nursing note and vitals reviewed.   ED Course  Procedures (including critical care time) Labs Review Labs Reviewed  CBC WITH DIFFERENTIAL  PROTIME-INR  BASIC METABOLIC PANEL  TROPONIN I    Imaging Review Dg Chest 1 View  11/08/2014   CLINICAL DATA:  38 year old male with history of chest pain and shortness of breath.  EXAM: CHEST - 1 VIEW  COMPARISON:  Chest x-ray 04/18/2013.  FINDINGS: Lung volumes are low. No consolidative airspace disease. No pleural effusions. No pneumothorax. No pulmonary nodule or mass noted. Pulmonary vasculature and the  cardiomediastinal silhouette are within normal limits. Orthopedic fixation hardware in the lower cervical spine incidentally noted.  IMPRESSION: No radiographic evidence of acute cardiopulmonary disease.   Electronically Signed   By: Vinnie Langton M.D.   On: 11/08/2014 20:50   Ct Head Wo Contrast  11/08/2014   CLINICAL DATA:  Code stroke.  Stuttering.  EXAM: CT HEAD WITHOUT CONTRAST  TECHNIQUE: Contiguous axial images were obtained from the base of the skull through the vertex without intravenous contrast.  COMPARISON:  None available.  FINDINGS: There is no acute intracranial hemorrhage or infarct. No mass lesion or midline shift. Gray-white matter differentiation is well maintained. Ventricles are normal in size without evidence of hydrocephalus. CSF containing spaces are within normal limits. No extra-axial fluid collection.  The calvarium is intact.  Orbital soft tissues are within normal limits.  Moderate polypoid mucosal thickening present within the maxillary sinuses bilaterally, left greater than right. No air-fluid levels identified. Mastoid air cells are hypoplastic and partially opacified bilaterally. Tympanic spaces are clear.  Scalp soft tissues are unremarkable.  IMPRESSION: 1. No acute intracranial process. 2. Moderate bilateral maxillary sinus disease.  Critical Value/emergent results were called by telephone at the time of interpretation on 11/08/2014 at 8:36 pm to Dr. Nicole Kindred, who verbally acknowledged these results.   Electronically Signed   By: Jeannine Boga M.D.   On: 11/08/2014 20:41     EKG Interpretation   Date/Time:  Wednesday November 08 2014 19:15:41 EST Ventricular Rate:  82 PR Interval:  166 QRS Duration: 127 QT Interval:  392 QTC Calculation: 458 R Axis:   -56 Text Interpretation:  Sinus rhythm RBBB and LAFB Confirmed by Hazle Coca  [phone]) on 11/08/2014 8:40:18 PM       7:44 PM Dr Ralene Bathe made aware of and will see patient.    Dr Ralene Bathe advises calling code  stroke.  Code stroke order placed.    8:12 PM Dr Nicole Kindred has seen and evaluated patient.    Patient speaking normally after ativan.    Provided smoking cessation counseling.   MDM   Final diagnoses:  Stuttering  Anxiety    Afebrile, nontoxic patient with stuttering speech that began after becoming angry at his daughter.  He has a history of anxiety.  Neuro was consulted and code stroke activated given the timeframe and concern for expressive aphasia.  Seen by Dr Nicole Kindred and code stroke cancelled.  CT negative.  Workup negative.  Symptoms completely resolved with ativan.  Pt did report some symptoms of heartburn, he states he has this frequently and that it is very mild and no different than his chronic symptoms.  It was atypical and I doubt ACS.   D/C home with close PCP follow up.  Pt has xanax at home.  Discussed result, findings, treatment, and follow up  with patient.  Pt given  return precautions.  Pt verbalizes understanding and agrees with plan.        Clayton Bibles, PA-C 11/08/14 [id number]  Quintella Reichert, MD 11/09/14 Dyann Kief

## 2014-11-08 NOTE — ED Notes (Signed)
Code stroke called @ [phone]

## 2014-11-08 NOTE — ED Notes (Signed)
EDP at bedside  

## 2014-11-11 ENCOUNTER — Encounter (HOSPITAL_COMMUNITY): Payer: Self-pay | Admitting: *Deleted

## 2014-11-11 ENCOUNTER — Emergency Department (HOSPITAL_COMMUNITY)
Admission: EM | Admit: 2014-11-11 | Discharge: 2014-11-11 | Disposition: A | Discharge status: Home / Self Care | Payer: Medicare Other | Attending: Emergency Medicine | Admitting: Emergency Medicine

## 2014-11-11 DIAGNOSIS — F419 Anxiety disorder, unspecified: Secondary | ICD-10-CM | POA: Diagnosis not present

## 2014-11-11 DIAGNOSIS — Z720 Tobacco use: Secondary | ICD-10-CM | POA: Insufficient documentation

## 2014-11-11 DIAGNOSIS — Z8639 Personal history of other endocrine, nutritional and metabolic disease: Secondary | ICD-10-CM | POA: Diagnosis not present

## 2014-11-11 DIAGNOSIS — Z79899 Other long term (current) drug therapy: Secondary | ICD-10-CM | POA: Insufficient documentation

## 2014-11-11 DIAGNOSIS — I10 Essential (primary) hypertension: Secondary | ICD-10-CM | POA: Diagnosis not present

## 2014-11-11 DIAGNOSIS — Z880 Allergy status to penicillin: Secondary | ICD-10-CM | POA: Diagnosis not present

## 2014-11-11 NOTE — ED Provider Notes (Signed)
CSN: [id number]     Arrival date & time 11/11/14  1959 History   First MD Initiated Contact with Patient 11/11/14 2022     Chief Complaint  Patient presents with  . Anxiety  . Hypertension     (Consider location/radiation/quality/duration/timing/severity/associated sxs/prior Treatment) HPI   39 year old male with history of hypertension presents for complaint of chest pain anxiety.  Patient was seen in the ED 2 days ago for essentially the same complaint. At that time he was having some slurring of speech and a code stroke was called however after evaluated by the neurologist, Dr. Nicole Kindred the code stroke was canceled. Patient's workup was unremarkable with a negative head CT. It was thought that his symptoms is likely related to anxiety. Today patient has an argument with his daughter, this upset him greatly. He then check his blood pressure and states that it was in the 180s. His daughter was concerned and called EMS which brought him to the ER. Patient states since being in the ER, he is feeling much better and no longer anxious. He denies having any significant lightheadedness or dizziness, chest pain, short of breath, diaphoresis, nausea or vomiting. He did take Clonidine prior to arrival. He admits that he has a history of anxiety in the past. He admits that he only take his clonidine when his blood pressure is high per recommendation of his physician. Patient is now at his baseline.  Past Medical History  Diagnosis Date  . High blood pressure   . Hypoglycemia    Past Surgical History  Procedure Laterality Date  . Back surgery    . Eye surgery     No family history on file. History  Substance Use Topics  . Smoking status: Current Every Day Smoker -- 2.00 packs/day    Types: Cigarettes  . Smokeless tobacco: Not on file  . Alcohol Use: No    Review of Systems  All other systems reviewed and are negative.     Allergies  Penicillins  Home Medications   Prior to  Admission medications   Medication Sig Start Date End Date Taking? Authorizing Provider  albuterol (PROVENTIL HFA;VENTOLIN HFA) 108 (90 BASE) MCG/ACT inhaler Inhale 2 puffs into the lungs every 6 (six) hours as needed for wheezing or shortness of breath.   Yes Historical Provider, MD  ALPRAZolam Duanne Moron) 0.5 MG tablet Take 0.5 mg by mouth 3 (three) times daily as needed for anxiety.   Yes Historical Provider, MD  celecoxib (CELEBREX) 200 MG capsule Take 200 mg by mouth 2 (two) times daily as needed for mild pain.  09/27/14  Yes Historical Provider, MD  cloNIDine (CATAPRES) 0.1 MG tablet Take 0.1 mg by mouth 3 (three) times daily as needed. Only take when blood pressure is high   Yes Historical Provider, MD  gabapentin (NEURONTIN) 300 MG capsule Take 600 mg by mouth 4 (four) times daily as needed. For pain   Yes Historical Provider, MD  hydroxypropyl methylcellulose / hypromellose (ISOPTO TEARS / GONIOVISC) 2.5 % ophthalmic solution Place 1 drop into both eyes 3 (three) times daily as needed for dry eyes.   Yes Historical Provider, MD  ibuprofen (ADVIL,MOTRIN) 200 MG tablet Take 800 mg by mouth every 6 (six) hours as needed for moderate pain.   Yes Historical Provider, MD  oxymorphone (OPANA) 5 MG tablet Take 5 mg by mouth every 6 (six) hours as needed for pain.  09/27/14  Yes Historical Provider, MD  sertraline (ZOLOFT) 100 MG tablet Take 100 mg  by mouth daily.     Yes Historical Provider, MD  tiZANidine (ZANAFLEX) 4 MG tablet Take 4 mg by mouth every 8 (eight) hours as needed for muscle spasms.  09/27/14  Yes Historical Provider, MD   BP 139/90 mmHg  Pulse 96  Temp(Src) 97.8 F (36.6 C) (Oral)  Resp 18  SpO2 94% Physical Exam  Constitutional: He is oriented to person, place, and time. He appears well-developed and well-nourished. No distress.  HENT:  Head: Atraumatic.  Eyes: Conjunctivae are normal.  Neck: Normal range of motion. Neck supple.  Cardiovascular: Normal rate and regular rhythm.    Pulmonary/Chest: Effort normal and breath sounds normal.  Abdominal: Soft. There is no tenderness.  Musculoskeletal: He exhibits no edema.  Neurological: He is alert and oriented to person, place, and time. He has normal strength. No cranial nerve deficit. GCS eye subscore is 4. GCS verbal subscore is 5. GCS motor subscore is 6.  Skin: No rash noted.  Psychiatric: He has a normal mood and affect.    ED Course  Procedures (including critical care time)  Patient had a anxiety attack after argument with his young daughter. He has history of anxiety. He reports having high blood pressure however his blood pressure has normalized while in the ER. He has no symptoms to suggest acute ischemic attack. He has had a stroke workup when presenting to the ER with similar complaint several days ago and the workup was negative. He has no focal neuro deficit on exam. He is at his baseline. His EKG shows no acute ischemic changes.  9:20 PM Shared decision making were made with pt between performing a full cardiac work up similar to recent visit.  However pt felt he prefers f/u with PCP for further care as needed.  Return precaution discussed.  Recommend talking to his PCP about his anxiety and to get treatment for it.    Labs Review Labs Reviewed - No data to display  Imaging Review No results found.   EKG Interpretation   Date/Time:  Saturday November 11 2014 20:12:07 EST Ventricular Rate:  90 PR Interval:  166 QRS Duration: 125 QT Interval:  379 QTC Calculation: 464 R Axis:   -62 Text Interpretation:  Sinus rhythm RBBB and LAFB agree. no change from  old. Confirmed by Johnney Killian, MD, Jeannie Done [phone]) on 11/11/2014 8:29:12 PM      MDM   Final diagnoses:  Anxiety    BP 139/90 mmHg  Pulse 96  Temp(Src) 97.8 F (36.6 C) (Oral)  Resp 18  SpO2 94%     Domenic Moras, PA-C 11/11/14 2123  Charlesetta Shanks, MD 11/12/14 [phone]

## 2014-11-11 NOTE — ED Notes (Signed)
Pt a/o x 4 and lying in bed comfortably. NAD noted. Gertie Fey, PA at bedside.

## 2014-11-11 NOTE — Discharge Instructions (Signed)
Please follow up closely with your primary care provider for further management of your condition.  Take clonidine for your blood pressure as prescribed.  Return to ER if your condition worsen or if you have other concerns.   Panic Attacks Panic attacks are sudden, short feelings of great fear or discomfort. You may have them for no reason when you are relaxed, when you are uneasy (anxious), or when you are sleeping.  HOME CARE  Take all your medicines as told.  Check with your doctor before starting new medicines.  Keep all doctor visits. GET HELP IF:  You are not able to take your medicines as told.  Your symptoms do not get better.  Your symptoms get worse. GET HELP RIGHT AWAY IF:  Your attacks seem different than your normal attacks.  You have thoughts about hurting yourself or others.  You take panic attack medicine and you have a side effect. MAKE SURE YOU:  Understand these instructions.  Will watch your condition.  Will get help right away if you are not doing well or get worse. Document Released: 11/29/2010 Document Revised: 08/17/2013 Document Reviewed: 06/10/2013 [facility] Patient Information 2015 [place], [place]. This information is not intended to replace advice given to you by your health care provider. Make sure you discuss any questions you have with your health care provider.

## 2014-11-11 NOTE — ED Notes (Signed)
Per EMS, pt has hx of hypertension and takes Clonidine 3 x day. However, pt states that the Clonidine makes his pressure "bottom out" so he only takes it when he feels like he needs it per his physician. Pt has some chest pain and anxiety d/t a family issue with his 41 year old daughter. Pt in [place]. No SOB, diaphoresis, dizziness, no HA, no blurred vision, and no weakness.

## 2016-07-30 DIAGNOSIS — G8929 Other chronic pain: Secondary | ICD-10-CM | POA: Insufficient documentation

## 2016-08-05 DIAGNOSIS — G5620 Lesion of ulnar nerve, unspecified upper limb: Secondary | ICD-10-CM | POA: Insufficient documentation

## 2016-08-05 DIAGNOSIS — R202 Paresthesia of skin: Secondary | ICD-10-CM | POA: Insufficient documentation

## 2016-08-09 ENCOUNTER — Inpatient Hospital Stay (HOSPITAL_COMMUNITY)
Admit: 2016-08-09 | Discharge: 2016-08-09 | Disposition: A | Discharge status: Home / Self Care | Payer: Medicare Other | Attending: Family Medicine | Admitting: Family Medicine

## 2016-08-09 ENCOUNTER — Inpatient Hospital Stay (HOSPITAL_COMMUNITY)
Admission: EM | Admit: 2016-08-09 | Discharge: 2016-08-11 | DRG: 866 | Disposition: A | Discharge status: Home / Self Care | Payer: Medicare Other | Attending: Internal Medicine | Admitting: Internal Medicine

## 2016-08-09 ENCOUNTER — Inpatient Hospital Stay (HOSPITAL_COMMUNITY): Payer: Medicare Other

## 2016-08-09 ENCOUNTER — Emergency Department (HOSPITAL_COMMUNITY): Payer: Medicare Other

## 2016-08-09 ENCOUNTER — Encounter (HOSPITAL_COMMUNITY): Payer: Self-pay | Admitting: Emergency Medicine

## 2016-08-09 DIAGNOSIS — R509 Fever, unspecified: Secondary | ICD-10-CM

## 2016-08-09 DIAGNOSIS — F17210 Nicotine dependence, cigarettes, uncomplicated: Secondary | ICD-10-CM | POA: Diagnosis present

## 2016-08-09 DIAGNOSIS — E876 Hypokalemia: Secondary | ICD-10-CM | POA: Diagnosis present

## 2016-08-09 DIAGNOSIS — B349 Viral infection, unspecified: Principal | ICD-10-CM | POA: Diagnosis present

## 2016-08-09 DIAGNOSIS — Z981 Arthrodesis status: Secondary | ICD-10-CM

## 2016-08-09 DIAGNOSIS — M79609 Pain in unspecified limb: Secondary | ICD-10-CM | POA: Diagnosis not present

## 2016-08-09 DIAGNOSIS — J449 Chronic obstructive pulmonary disease, unspecified: Secondary | ICD-10-CM | POA: Diagnosis present

## 2016-08-09 DIAGNOSIS — A419 Sepsis, unspecified organism: Secondary | ICD-10-CM

## 2016-08-09 DIAGNOSIS — F1120 Opioid dependence, uncomplicated: Secondary | ICD-10-CM | POA: Diagnosis present

## 2016-08-09 DIAGNOSIS — I959 Hypotension, unspecified: Secondary | ICD-10-CM | POA: Diagnosis present

## 2016-08-09 DIAGNOSIS — Z9049 Acquired absence of other specified parts of digestive tract: Secondary | ICD-10-CM

## 2016-08-09 DIAGNOSIS — E860 Dehydration: Secondary | ICD-10-CM | POA: Diagnosis present

## 2016-08-09 DIAGNOSIS — M25552 Pain in left hip: Secondary | ICD-10-CM | POA: Diagnosis present

## 2016-08-09 DIAGNOSIS — G8929 Other chronic pain: Secondary | ICD-10-CM

## 2016-08-09 DIAGNOSIS — F17200 Nicotine dependence, unspecified, uncomplicated: Secondary | ICD-10-CM | POA: Diagnosis present

## 2016-08-09 DIAGNOSIS — I10 Essential (primary) hypertension: Secondary | ICD-10-CM

## 2016-08-09 DIAGNOSIS — R6520 Severe sepsis without septic shock: Secondary | ICD-10-CM | POA: Diagnosis present

## 2016-08-09 DIAGNOSIS — Z79899 Other long term (current) drug therapy: Secondary | ICD-10-CM

## 2016-08-09 DIAGNOSIS — R6889 Other general symptoms and signs: Secondary | ICD-10-CM | POA: Diagnosis present

## 2016-08-09 HISTORY — DX: Opioid dependence, uncomplicated: F11.20

## 2016-08-09 HISTORY — DX: Other chronic pain: G89.29

## 2016-08-09 HISTORY — DX: Nicotine dependence, unspecified, uncomplicated: F17.200

## 2016-08-09 HISTORY — DX: Chronic obstructive pulmonary disease, unspecified: J44.9

## 2016-08-09 LAB — I-STAT CG4 LACTIC ACID, ED: Lactic Acid, Venous: 2.21 mmol/L (ref 0.5–1.9)

## 2016-08-09 LAB — URINALYSIS, ROUTINE W REFLEX MICROSCOPIC
Bilirubin Urine: NEGATIVE
Glucose, UA: NEGATIVE mg/dL
KETONES UR: NEGATIVE mg/dL
LEUKOCYTES UA: NEGATIVE
NITRITE: NEGATIVE
PH: 7 (ref 5.0–8.0)
Protein, ur: NEGATIVE mg/dL
SPECIFIC GRAVITY, URINE: 1.011 (ref 1.005–1.030)

## 2016-08-09 LAB — CBC WITH DIFFERENTIAL/PLATELET
Basophils Absolute: 0 10*3/uL (ref 0.0–0.1)
Basophils Relative: 0 %
EOS ABS: 0.1 10*3/uL (ref 0.0–0.7)
EOS PCT: 1 %
HCT: 39.1 % (ref 39.0–52.0)
Hemoglobin: 14.1 g/dL (ref 13.0–17.0)
LYMPHS ABS: 1.2 10*3/uL (ref 0.7–4.0)
Lymphocytes Relative: 5 %
MCH: 30.5 pg (ref 26.0–34.0)
MCHC: 36.1 g/dL — AB (ref 30.0–36.0)
MCV: 84.4 fL (ref 78.0–100.0)
MONOS PCT: 8 %
Monocytes Absolute: 1.8 10*3/uL — ABNORMAL HIGH (ref 0.1–1.0)
Neutro Abs: 19.4 10*3/uL — ABNORMAL HIGH (ref 1.7–7.7)
Neutrophils Relative %: 86 %
PLATELETS: 237 10*3/uL (ref 150–400)
RBC: 4.63 MIL/uL (ref 4.22–5.81)
RDW: 12.7 % (ref 11.5–15.5)
WBC: 22.7 10*3/uL — ABNORMAL HIGH (ref 4.0–10.5)

## 2016-08-09 LAB — COMPREHENSIVE METABOLIC PANEL
ALT: 28 U/L (ref 17–63)
ANION GAP: 10 (ref 5–15)
AST: 21 U/L (ref 15–41)
Albumin: 4.6 g/dL (ref 3.5–5.0)
Alkaline Phosphatase: 74 U/L (ref 38–126)
BUN: 9 mg/dL (ref 6–20)
CALCIUM: 8.9 mg/dL (ref 8.9–10.3)
CHLORIDE: 106 mmol/L (ref 101–111)
CO2: 24 mmol/L (ref 22–32)
Creatinine, Ser: 0.65 mg/dL (ref 0.61–1.24)
GFR calc non Af Amer: >60 mL/min (ref >60)
Glucose, Bld: 100 mg/dL — ABNORMAL HIGH (ref 65–99)
POTASSIUM: 3.2 mmol/L — AB (ref 3.5–5.1)
SODIUM: 140 mmol/L (ref 135–145)
Total Bilirubin: 0.9 mg/dL (ref 0.3–1.2)
Total Protein: 7.5 g/dL (ref 6.5–8.1)

## 2016-08-09 LAB — INFLUENZA PANEL BY PCR (TYPE A & B)
H1N1 flu by pcr: NOT DETECTED
INFLAPCR: NEGATIVE
Influenza B By PCR: NEGATIVE

## 2016-08-09 LAB — LACTIC ACID, PLASMA
LACTIC ACID, VENOUS: 1.9 mmol/L (ref 0.5–1.9)
Lactic Acid, Venous: 1.9 mmol/L (ref 0.5–1.9)

## 2016-08-09 LAB — URINE MICROSCOPIC-ADD ON

## 2016-08-09 MED ORDER — MORPHINE SULFATE 15 MG PO TABS: 15.0000 mg | ORAL_TABLET | Freq: Four times a day (QID) | ORAL | Status: DC | PRN

## 2016-08-09 MED ORDER — BENAZEPRIL HCL 10 MG PO TABS
10.0000 mg | ORAL_TABLET | Freq: Every day | ORAL | Status: DC
  Administered 2016-08-09 – 2016-08-11 (×3): 10 mg via ORAL
  Filled 2016-08-09 (×3): qty 1

## 2016-08-09 MED ORDER — POLYVINYL ALCOHOL 1.4 % OP SOLN
1.0000 [drp] | OPHTHALMIC | Status: DC | PRN
  Filled 2016-08-09: qty 15

## 2016-08-09 MED ORDER — ONDANSETRON HCL 4 MG/2ML IJ SOLN: 4.0000 mg | Freq: Four times a day (QID) | INTRAMUSCULAR | Status: DC | PRN

## 2016-08-09 MED ORDER — POTASSIUM CHLORIDE CRYS ER 20 MEQ PO TBCR
40.0000 meq | EXTENDED_RELEASE_TABLET | Freq: Once | ORAL | Status: AC
  Administered 2016-08-09: 40 meq via ORAL
  Filled 2016-08-09: qty 2

## 2016-08-09 MED ORDER — DEXTROSE 5 % IV SOLN
1.0000 g | Freq: Three times a day (TID) | INTRAVENOUS | Status: DC
  Administered 2016-08-09 – 2016-08-10 (×3): 1 g via INTRAVENOUS
  Filled 2016-08-09 (×3): qty 1

## 2016-08-09 MED ORDER — SODIUM CHLORIDE 0.9 % IV BOLUS (SEPSIS)
1000.0000 mL | Freq: Once | INTRAVENOUS | Status: AC
  Administered 2016-08-09: 1000 mL via INTRAVENOUS

## 2016-08-09 MED ORDER — ENOXAPARIN SODIUM 40 MG/0.4ML ~~LOC~~ SOLN
40.0000 mg | SUBCUTANEOUS | Status: DC
  Administered 2016-08-09 – 2016-08-10 (×2): 40 mg via SUBCUTANEOUS
  Filled 2016-08-09 (×2): qty 0.4

## 2016-08-09 MED ORDER — IBUPROFEN 800 MG PO TABS
800.0000 mg | ORAL_TABLET | Freq: Three times a day (TID) | ORAL | Status: DC | PRN
  Administered 2016-08-10: 800 mg via ORAL
  Filled 2016-08-09: qty 1

## 2016-08-09 MED ORDER — IPRATROPIUM-ALBUTEROL 0.5-2.5 (3) MG/3ML IN SOLN
3.0000 mL | Freq: Four times a day (QID) | RESPIRATORY_TRACT | Status: DC | PRN
  Administered 2016-08-10: 3 mL via RESPIRATORY_TRACT
  Filled 2016-08-09: qty 3

## 2016-08-09 MED ORDER — HYPROMELLOSE (GONIOSCOPIC) 2.5 % OP SOLN: 1.0000 [drp] | Freq: Three times a day (TID) | OPHTHALMIC | Status: DC | PRN

## 2016-08-09 MED ORDER — SERTRALINE HCL 50 MG PO TABS
100.0000 mg | ORAL_TABLET | Freq: Every day | ORAL | Status: DC
  Administered 2016-08-10: 100 mg via ORAL
  Filled 2016-08-09 (×2): qty 2

## 2016-08-09 MED ORDER — VANCOMYCIN HCL IN DEXTROSE 1-5 GM/200ML-% IV SOLN
1000.0000 mg | Freq: Three times a day (TID) | INTRAVENOUS | Status: DC
  Administered 2016-08-09 – 2016-08-11 (×6): 1000 mg via INTRAVENOUS
  Filled 2016-08-09 (×6): qty 200

## 2016-08-09 MED ORDER — DOCUSATE SODIUM 100 MG PO CAPS
100.0000 mg | ORAL_CAPSULE | Freq: Two times a day (BID) | ORAL | Status: DC
  Administered 2016-08-09 – 2016-08-11 (×5): 100 mg via ORAL
  Filled 2016-08-09 (×5): qty 1

## 2016-08-09 MED ORDER — SODIUM CHLORIDE 0.9 % IV SOLN
INTRAVENOUS | Status: DC
  Administered 2016-08-09 – 2016-08-10 (×3): via INTRAVENOUS

## 2016-08-09 MED ORDER — VANCOMYCIN HCL IN DEXTROSE 1-5 GM/200ML-% IV SOLN
1000.0000 mg | Freq: Once | INTRAVENOUS | Status: AC
  Administered 2016-08-09: 1000 mg via INTRAVENOUS
  Filled 2016-08-09: qty 200

## 2016-08-09 MED ORDER — OSELTAMIVIR PHOSPHATE 75 MG PO CAPS
75.0000 mg | ORAL_CAPSULE | Freq: Two times a day (BID) | ORAL | Status: DC
  Administered 2016-08-09 (×2): 75 mg via ORAL
  Filled 2016-08-09 (×3): qty 1

## 2016-08-09 MED ORDER — BACLOFEN 10 MG PO TABS
10.0000 mg | ORAL_TABLET | Freq: Three times a day (TID) | ORAL | Status: DC
  Administered 2016-08-09 – 2016-08-11 (×6): 10 mg via ORAL
  Filled 2016-08-09 (×6): qty 1

## 2016-08-09 MED ORDER — SODIUM CHLORIDE 0.9% FLUSH
3.0000 mL | Freq: Two times a day (BID) | INTRAVENOUS | Status: DC
  Administered 2016-08-09 – 2016-08-10 (×2): 3 mL via INTRAVENOUS

## 2016-08-09 MED ORDER — DEXTROSE 5 % IV SOLN
2.0000 g | Freq: Once | INTRAVENOUS | Status: AC
  Administered 2016-08-09: 2 g via INTRAVENOUS
  Filled 2016-08-09: qty 2

## 2016-08-09 MED ORDER — TIOTROPIUM BROMIDE MONOHYDRATE 18 MCG IN CAPS
18.0000 ug | ORAL_CAPSULE | Freq: Every day | RESPIRATORY_TRACT | Status: DC
  Administered 2016-08-10 – 2016-08-11 (×2): 18 ug via RESPIRATORY_TRACT
  Filled 2016-08-09: qty 5

## 2016-08-09 MED ORDER — ALBUTEROL SULFATE HFA 108 (90 BASE) MCG/ACT IN AERS: 2.0000 | INHALATION_SPRAY | Freq: Four times a day (QID) | RESPIRATORY_TRACT | Status: DC | PRN

## 2016-08-09 MED ORDER — POTASSIUM CHLORIDE 10 MEQ/100ML IV SOLN
10.0000 meq | INTRAVENOUS | Status: AC
Start: 2016-08-09 — End: 2016-08-09
  Administered 2016-08-09 (×3): 10 meq via INTRAVENOUS
  Filled 2016-08-09 (×3): qty 100

## 2016-08-09 MED ORDER — SODIUM CHLORIDE 0.9 % IV BOLUS (SEPSIS)
500.0000 mL | Freq: Once | INTRAVENOUS | Status: AC
  Administered 2016-08-09: 500 mL via INTRAVENOUS

## 2016-08-09 MED ORDER — LEVOFLOXACIN IN D5W 750 MG/150ML IV SOLN
750.0000 mg | INTRAVENOUS | Status: DC
  Administered 2016-08-10: 750 mg via INTRAVENOUS
  Filled 2016-08-09: qty 150

## 2016-08-09 MED ORDER — PANTOPRAZOLE SODIUM 40 MG PO TBEC
40.0000 mg | DELAYED_RELEASE_TABLET | Freq: Every day | ORAL | Status: DC
  Administered 2016-08-09 – 2016-08-11 (×3): 40 mg via ORAL
  Filled 2016-08-09 (×3): qty 1

## 2016-08-09 MED ORDER — ACETAMINOPHEN 650 MG RE SUPP
650.0000 mg | Freq: Four times a day (QID) | RECTAL | Status: DC | PRN
Start: 2016-08-09 — End: 2016-08-11

## 2016-08-09 MED ORDER — LEVOFLOXACIN IN D5W 750 MG/150ML IV SOLN
750.0000 mg | Freq: Once | INTRAVENOUS | Status: AC
  Administered 2016-08-09: 750 mg via INTRAVENOUS
  Filled 2016-08-09: qty 150

## 2016-08-09 MED ORDER — GABAPENTIN 300 MG PO CAPS
600.0000 mg | ORAL_CAPSULE | Freq: Four times a day (QID) | ORAL | Status: DC
  Administered 2016-08-09 – 2016-08-11 (×8): 600 mg via ORAL
  Filled 2016-08-09 (×8): qty 2

## 2016-08-09 MED ORDER — IPRATROPIUM-ALBUTEROL 0.5-2.5 (3) MG/3ML IN SOLN
3.0000 mL | Freq: Once | RESPIRATORY_TRACT | Status: AC
  Administered 2016-08-09: 3 mL via RESPIRATORY_TRACT
  Filled 2016-08-09: qty 3

## 2016-08-09 MED ORDER — BISACODYL 5 MG PO TBEC
5.0000 mg | DELAYED_RELEASE_TABLET | Freq: Every day | ORAL | Status: DC | PRN
  Filled 2016-08-09: qty 1

## 2016-08-09 MED ORDER — BENAZEPRIL-HYDROCHLOROTHIAZIDE 10-12.5 MG PO TABS: 1.0000 | ORAL_TABLET | Freq: Every day | ORAL | Status: DC

## 2016-08-09 MED ORDER — IOPAMIDOL (ISOVUE-300) INJECTION 61%
100.0000 mL | Freq: Once | INTRAVENOUS | Status: AC | PRN
  Administered 2016-08-09: 100 mL via INTRAVENOUS

## 2016-08-09 MED ORDER — ONDANSETRON HCL 4 MG PO TABS: 4.0000 mg | ORAL_TABLET | Freq: Four times a day (QID) | ORAL | Status: DC | PRN

## 2016-08-09 MED ORDER — AMLODIPINE BESYLATE 5 MG PO TABS
10.0000 mg | ORAL_TABLET | Freq: Every day | ORAL | Status: DC
  Administered 2016-08-09 – 2016-08-11 (×3): 10 mg via ORAL
  Filled 2016-08-09 (×3): qty 2

## 2016-08-09 MED ORDER — SENNOSIDES-DOCUSATE SODIUM 8.6-50 MG PO TABS: 1.0000 | ORAL_TABLET | Freq: Every evening | ORAL | Status: DC | PRN

## 2016-08-09 MED ORDER — HYDROCHLOROTHIAZIDE 12.5 MG PO CAPS
12.5000 mg | ORAL_CAPSULE | Freq: Every day | ORAL | Status: DC
  Administered 2016-08-09 – 2016-08-11 (×3): 12.5 mg via ORAL
  Filled 2016-08-09 (×3): qty 1

## 2016-08-09 MED ORDER — ACETAMINOPHEN 325 MG PO TABS
650.0000 mg | ORAL_TABLET | Freq: Four times a day (QID) | ORAL | Status: DC | PRN
  Administered 2016-08-09: 650 mg via ORAL
  Filled 2016-08-09: qty 2

## 2016-08-09 NOTE — Progress Notes (Signed)
Pharmacy Antibiotic Note  [name] is a 40 y.o. male admitted on 08/09/2016 with sepsis.  Pharmacy has been consulted for Vanc/aztreonam/levaquin dosing.  Plan: 1) Vancomycin 1g IV q8. Goal trough 15-20 mcg/mL 2) Aztreonam 2g x 1 then 1g IV q8 3) Levaquin 750mg  IV q24 4) Narrow Antibiotics as soon as possible.     Temp (24hrs), Avg:100.3 F (37.9 C), Min:100.3 F (37.9 C), Max:100.3 F (37.9 C)   Recent Labs Lab 08/09/16 0627 08/09/16 0736  WBC 22.7*  --   CREATININE 0.65  --   LATICACIDVEN  --  2.21*    CrCl cannot be calculated (Unknown ideal weight.).    Allergies  Allergen Reactions  . Penicillins     unknown    Thank you for allowing pharmacy to be a part of this patient's care.   Adrian Saran, PharmD, BCPS Pager [phone] 08/09/2016 9:36 AM

## 2016-08-09 NOTE — ED Notes (Signed)
Patient transported to X-ray 

## 2016-08-09 NOTE — ED Notes (Signed)
Patient transported to CT 

## 2016-08-09 NOTE — ED Notes (Signed)
Bed: [id number] Expected date:  Expected time:  Means of arrival:  Comments: EMS 40yo M chills/ fever

## 2016-08-09 NOTE — Progress Notes (Signed)
VASCULAR LAB PRELIMINARY  PRELIMINARY  PRELIMINARY  PRELIMINARY  Left lower extremity venous duplex completed.    Preliminary report:  Left:  No evidence of DVT, superficial thrombosis, or Baker's cyst.  Kashawna Manzer, RVS 08/09/2016, 11:06 AM

## 2016-08-09 NOTE — H&P (Signed)
History and Physical  ROERT CULLIGAN [id number] DOB: 20-Oct-1976 DOA: 08/09/2016  Referring physician: Chrissie Noa PCP: Crisoforo Oxford, PA (Inactive)   Chief Complaint: fever and chills  HPI: [name] is a 40 y.o. male who presented to the emergency department with flulike symptoms that started early this morning. He reports that he woke up with rigors. He reports generalized body aches. He denies shortness of breath and cough. He had a temperature of 10 66F. He does have COPD but reports it has been fairly stable. He also notes pain in the left thigh.He was noted in the ED to have a white blood cell count of 22. He was noted to have an elevated lactate and was hypotensive. He was started on a sepsis bundle and admission was requested.  An influenza panel pending. Patient has not received a flu vaccine this season.  Review of Systems: All systems reviewed and apart from history of presenting illness, are negative.  Past Medical History:  Diagnosis Date  . Chronic pain   . COPD (chronic obstructive pulmonary disease) ([place])   . Current every day smoker   . High blood pressure   . Hypoglycemia   . Opioid dependence ([place])    Past Surgical History:  Procedure Laterality Date  . BACK SURGERY    . EYE SURGERY     Social History:  reports that he has been smoking Cigarettes.  He has been smoking about 2.00 packs per day. He does not have any smokeless tobacco history on file. He reports that he does not drink alcohol. His drug history is not on file.   Allergies  Allergen Reactions  . Penicillins     unknown    History reviewed. No pertinent family history.  Prior to Admission medications   Medication Sig Start Date End Date Taking? Authorizing Provider  albuterol (PROVENTIL HFA;VENTOLIN HFA) 108 (90 BASE) MCG/ACT inhaler Inhale 2 puffs into the lungs every 6 (six) hours as needed for wheezing or shortness of breath.   Yes Historical Provider, MD  amLODipine  (NORVASC) 10 MG tablet Take 10 mg by mouth daily. 07/29/16  Yes Historical Provider, MD  baclofen (LIORESAL) 10 MG tablet Take 10 mg by mouth 3 (three) times daily. 08/04/16  Yes Historical Provider, MD  benazepril-hydrochlorthiazide (LOTENSIN HCT) 10-12.5 MG tablet Take 1 tablet by mouth daily. 07/29/16  Yes Historical Provider, MD  diclofenac (VOLTAREN) 75 MG EC tablet Take 75 mg by mouth daily. 07/29/16  Yes Historical Provider, MD  gabapentin (NEURONTIN) 600 MG tablet Take 600 mg by mouth 4 (four) times daily. 08/04/16  Yes Historical Provider, MD  hydroxypropyl methylcellulose / hypromellose (ISOPTO TEARS / GONIOVISC) 2.5 % ophthalmic solution Place 1 drop into both eyes 3 (three) times daily as needed for dry eyes.   Yes Historical Provider, MD  omeprazole (PRILOSEC) 20 MG capsule Take 20 mg by mouth daily. 07/29/16  Yes Historical Provider, MD  oxymorphone (OPANA) 5 MG tablet Take 5 mg by mouth 4 (four) times daily.  09/27/14  Yes Historical Provider, MD  sertraline (ZOLOFT) 100 MG tablet Take 100 mg by mouth daily.     Yes Historical Provider, MD  SPIRIVA HANDIHALER 18 MCG inhalation capsule Place 18 mcg into inhaler and inhale daily. 08/04/16  Yes Historical Provider, MD  ibuprofen (ADVIL,MOTRIN) 200 MG tablet Take 800 mg by mouth every 6 (six) hours as needed for moderate pain.    Historical Provider, MD   Physical Exam: Vitals:   08/09/16 [id number]  08/09/16 0830 08/09/16 0900 08/09/16 0930  BP: 117/69 114/72 107/95 128/99  Pulse: 107 87 86 97  Resp: 19  12 14   Temp: 100.3 F (37.9 C)     TempSrc: Axillary     SpO2: 96% 96% 99% 100%     General exam: Moderately built and nourished patient, lying comfortably supine on the gurney in no obvious distress.  Head, eyes and ENT: Nontraumatic and normocephalic. Poor dentition.  Pupils equally reacting to light and accommodation. Oral mucosa moist.  Neck: no meningismus signs. No JVD, carotid bruit or thyromegaly.  Lymphatics: No  lymphadenopathy.  Respiratory system: Clear to auscultation. No increased work of breathing.  Cardiovascular system: S1 and S2 heard, RRR. No JVD, murmurs, gallops, clicks or pedal edema.  Gastrointestinal system: Abdomen is nondistended, soft and nontender. Normal bowel sounds heard. No organomegaly or masses appreciated.  Central nervous system: Alert and oriented. No focal neurological deficits.  Extremities: Symmetric 5 x 5 power. Peripheral pulses symmetrically felt.   Skin: No rashes or acute findings.  Musculoskeletal system: tenderness in the left upper thigh.  Psychiatry: Pleasant and cooperative.   Labs on Admission:  Basic Metabolic Panel:  Recent Labs Lab 08/09/16 0627  NA 140  K 3.2*  CL 106  CO2 24  GLUCOSE 100*  BUN 9  CREATININE 0.65  CALCIUM 8.9   Liver Function Tests:  Recent Labs Lab 08/09/16 0627  AST 21  ALT 28  ALKPHOS 74  BILITOT 0.9  PROT 7.5  ALBUMIN 4.6   No results for input(s): LIPASE, AMYLASE in the last 168 hours. No results for input(s): AMMONIA in the last 168 hours. CBC:  Recent Labs Lab 08/09/16 0627  WBC 22.7*  NEUTROABS 19.4*  HGB 14.1  HCT 39.1  MCV 84.4  PLT 237   Cardiac Enzymes: No results for input(s): CKTOTAL, CKMB, CKMBINDEX, TROPONINI in the last 168 hours.  BNP (last 3 results) No results for input(s): PROBNP in the last 8760 hours. CBG: No results for input(s): GLUCAP in the last 168 hours.  Radiological Exams on Admission: Dg Chest 2 View  Result Date: 08/09/2016 CLINICAL DATA:  Pt complains of SOB, chills, cough, congestion, sweats since 3 am. Hx of COPD, HTN and an ex-smoker. EXAM: CHEST  2 VIEW COMPARISON:  11/08/2014 FINDINGS: The heart size and mediastinal contours are within normal limits. Both lungs are clear. No pleural effusion or pneumothorax. Stable changes from a previous anterior cervical spine fusion. Bony thorax is intact. IMPRESSION: No active cardiopulmonary disease. Electronically  Signed   By: Lajean Manes M.D.   On: 08/09/2016 07:31    EKG: Independently reviewed.  Assessment/Plan Principal Problem:   Severe sepsis (HCC) Active Problems:   Hypertension   Fever, unknown origin   Rigors   Current every day smoker   COPD (chronic obstructive pulmonary disease) (HCC)   Chronic pain   Opioid dependence (HCC)   Hypokalemia  1. Severe sepsis-continue sepsis bundle with hydration, following trend lactic acid, continue broad-spectrum antibiotic therapy pending further identification of source. CT chest abdomen and pelvis ordered. Influenza panel is pending. Start Tamiflu pending results of influenza testing and droplet precautions ordered. Admit to a telemetry bed. 2. Essential hypertension-patient initially hypotensive and holding blood pressure medication at this time hopefully can resume tomorrow as blood pressure is improving with hydration. 3. Fever unknown origin-workup noted as above. 4. COPD with current active nicotine dependence-patient counseled at bedside. He is not in acute exacerbation at this time. Ordered for inhalers  and nebs as needed. Patient declined nicotine patch at this time. 5. Chronic pain and opioid dependence-resuming home medications. 6. Hypokalemia-treating orally and IV. Check magnesium. Repeat BMP in the morning. 7. Left hip pain-check vascular ultrasound to rule out DVT.   DVT Prophylaxis: enoxaparin Code Status: full  Family Communication: none at bedside  Disposition Plan: return home with medically stabilized   Irwin Brakeman, MD [facility] Pager [phone]  If 7PM-7AM, please contact night-coverage [email or web address] Password [email or web address] 08/09/2016, 10:42 AM

## 2016-08-09 NOTE — ED Notes (Signed)
Unable to collect lab patient is getting a ultrasound

## 2016-08-09 NOTE — ED Notes (Signed)
Pt can go up at 11:44.

## 2016-08-09 NOTE — ED Provider Notes (Signed)
[facility] DEPT Provider Note   CSN: [id number] Arrival date & time: 08/09/16  [id number]     History   Chief Complaint Chief Complaint  Patient presents with  . Generalized Body Aches  . Chills  . Nausea    HPI [name] is a 40 y.o. male.  40 year old Caucasian male past medical history significant for COPD, hypertension, chronic back and neck pain presents to the ED today with flulike symptoms. Patient states he woke up this morning with fever and chills. He was shivering so his wife called the EMS. Patient reports a generalized body aches that started this morning. Has tried nothing for the pain. Nothing makes better or worse. He was noted to have a temperature of 101 in route by EMS. He was given 1 g of Tylenol. Patient denies any headache, vision changes, chest pain, shortness breath, nausea, vomiting, abdominal pain, urinary symptoms, change in bowel habits, numbness/tingling.        Past Medical History:  Diagnosis Date  . High blood pressure   . Hypoglycemia     Patient Active Problem List   Diagnosis Date Noted  . Speech abnormality 11/08/2014  . Hypertension 11/08/2014  . Spinal stenosis in cervical region 04/30/2011  . Shoulder pain 04/30/2011  . INGROWN NAIL 12/18/2010  . SHOULDER PAIN 12/18/2010  . IMPINGEMENT SYNDROME 12/18/2010  . LATERAL EPICONDYLITIS 12/18/2010    Past Surgical History:  Procedure Laterality Date  . BACK SURGERY    . EYE SURGERY         Home Medications    Prior to Admission medications   Medication Sig Start Date End Date Taking? Authorizing Provider  albuterol (PROVENTIL HFA;VENTOLIN HFA) 108 (90 BASE) MCG/ACT inhaler Inhale 2 puffs into the lungs every 6 (six) hours as needed for wheezing or shortness of breath.   Yes Historical Provider, MD  amLODipine (NORVASC) 10 MG tablet Take 10 mg by mouth daily. 07/29/16  Yes Historical Provider, MD  baclofen (LIORESAL) 10 MG tablet Take 10 mg by mouth 3 (three) times daily.  08/04/16  Yes Historical Provider, MD  benazepril-hydrochlorthiazide (LOTENSIN HCT) 10-12.5 MG tablet Take 1 tablet by mouth daily. 07/29/16  Yes Historical Provider, MD  diclofenac (VOLTAREN) 75 MG EC tablet Take 75 mg by mouth daily. 07/29/16  Yes Historical Provider, MD  gabapentin (NEURONTIN) 600 MG tablet Take 600 mg by mouth 4 (four) times daily. 08/04/16  Yes Historical Provider, MD  hydroxypropyl methylcellulose / hypromellose (ISOPTO TEARS / GONIOVISC) 2.5 % ophthalmic solution Place 1 drop into both eyes 3 (three) times daily as needed for dry eyes.   Yes Historical Provider, MD  omeprazole (PRILOSEC) 20 MG capsule Take 20 mg by mouth daily. 07/29/16  Yes Historical Provider, MD  oxymorphone (OPANA) 5 MG tablet Take 5 mg by mouth 4 (four) times daily.  09/27/14  Yes Historical Provider, MD  sertraline (ZOLOFT) 100 MG tablet Take 100 mg by mouth daily.     Yes Historical Provider, MD  SPIRIVA HANDIHALER 18 MCG inhalation capsule Place 18 mcg into inhaler and inhale daily. 08/04/16  Yes Historical Provider, MD  ibuprofen (ADVIL,MOTRIN) 200 MG tablet Take 800 mg by mouth every 6 (six) hours as needed for moderate pain.    Historical Provider, MD    Family History No family history on file.  Social History Social History  Substance Use Topics  . Smoking status: Current Every Day Smoker    Packs/day: 2.00    Types: Cigarettes  . Smokeless tobacco: Not  on file  . Alcohol use No     Allergies   Penicillins   Review of Systems Review of Systems  Constitutional: Positive for chills, diaphoresis and fever.  HENT: Negative for congestion, ear pain, rhinorrhea and sore throat.   Eyes: Negative for pain and discharge.  Respiratory: Negative for cough and shortness of breath.   Cardiovascular: Negative for chest pain and palpitations.  Gastrointestinal: Negative for abdominal pain, diarrhea, nausea and vomiting.  Genitourinary: Negative for flank pain, frequency, hematuria and urgency.    Musculoskeletal: Positive for back pain (chronic) and neck pain (chronic). Negative for myalgias.  Neurological: Negative for dizziness, syncope, weakness, light-headedness, numbness and headaches.  All other systems reviewed and are negative.    Physical Exam Updated Vital Signs BP 128/62   Pulse 91   Temp 98.4 F (36.9 C) (Oral)   Resp 20   Ht 5\' 9"  (1.753 m)   Wt 112.5 kg   SpO2 98%   BMI 36.63 kg/m   Physical Exam  Constitutional: He appears well-developed and well-nourished. No distress.  HENT:  Head: Normocephalic and atraumatic.  Right Ear: External ear normal.  Left Ear: External ear normal.  Mouth/Throat: Oropharynx is clear and moist and mucous membranes are normal. No posterior oropharyngeal edema or posterior oropharyngeal erythema.  Eyes: Conjunctivae are normal. Pupils are equal, round, and reactive to light. Right eye exhibits no discharge. Left eye exhibits no discharge. No scleral icterus.  Neck: Normal range of motion. Neck supple. No thyromegaly present.  Cardiovascular: Normal rate, regular rhythm, normal heart sounds and intact distal pulses.  Exam reveals no gallop and no friction rub.   No murmur heard. Pulmonary/Chest: Effort normal and breath sounds normal. No respiratory distress. He has no wheezes.  Abdominal: Soft. Bowel sounds are normal. He exhibits no distension. There is no tenderness. There is no rebound and no guarding.  Musculoskeletal: Normal range of motion.  Lymphadenopathy:    He has no cervical adenopathy.  Neurological: He is alert.  Skin: Skin is warm. He is diaphoretic.  Vitals reviewed.    ED Treatments / Results  Labs (all labs ordered are listed, but only abnormal results are displayed) Labs Reviewed  CBC WITH DIFFERENTIAL/PLATELET - Abnormal; Notable for the following:       Result Value   WBC 22.7 (*)    MCHC 36.1 (*)    Neutro Abs 19.4 (*)    Monocytes Absolute 1.8 (*)    All other components within normal limits   COMPREHENSIVE METABOLIC PANEL - Abnormal; Notable for the following:    Potassium 3.2 (*)    Glucose, Bld 100 (*)    All other components within normal limits  URINALYSIS, ROUTINE W REFLEX MICROSCOPIC (NOT AT [facility]) - Abnormal; Notable for the following:    Hgb urine dipstick TRACE (*)    All other components within normal limits  URINE MICROSCOPIC-ADD ON - Abnormal; Notable for the following:    Squamous Epithelial / LPF 0-5 (*)    Bacteria, UA RARE (*)    All other components within normal limits  I-STAT CG4 LACTIC ACID, ED - Abnormal; Notable for the following:    Lactic Acid, Venous 2.21 (*)    All other components within normal limits  CULTURE, BLOOD (ROUTINE X 2)  CULTURE, BLOOD (ROUTINE X 2)  URINE CULTURE  INFLUENZA PANEL BY PCR (TYPE A & B, H1N1)  LACTIC ACID, PLASMA  LACTIC ACID, PLASMA    EKG  EKG Interpretation  Date/Time:  Saturday August 09 2016 08:39:56 EDT Ventricular Rate:  88 PR Interval:    QRS Duration: 132 QT Interval:  388 QTC Calculation: 470 R Axis:   -58 Text Interpretation:  Sinus rhythm RBBB and LAFB No significant change since last tracing Confirmed by [facility]  MD, MARTHA [id number]) on 08/09/2016 11:32:05 AM       Radiology No results found.  Procedures Procedures (including critical care time)  Medications Ordered in ED Medications  sodium chloride 0.9 % bolus 1,000 mL (not administered)     Initial Impression / Assessment and Plan / ED Course  I have reviewed the triage vital signs and the nursing notes.  Pertinent labs & imaging results that were available during my care of the patient were reviewed by me and considered in my medical decision making (see chart for details).  Clinical Course  Patient presented to ED with fever and flulike symptoms. He was noted a fever of 101. Tylenol given in route by EMS. The patient was also noted to be tachycardic 1:15. Basic labs show white count 22. Patient denies any recent steroid use. He  does not have an elevated lactic acid 2.2. Chest x-ray was unremarkable. UA was unremarkable. Patient was also noted to be mildly hypotensive. Sepsis protocol was initiated with 30 mL per KG fluid bolus. Patient with penicillin allergy. Broad-spectrum antibiotics started. An influenza panel is pending. Patient has no acute complaints at this time. Patient seen and evaluated by Dr. Avon Gully who agrees with above plan. Will call for hospital admission for sepsis workup. Dr. Wynetta Emery who agrees to admit patient. Patient is agreeable to plan. He is hemodynamically stable this time.  CRITICAL CARE Performed by: Ocie Cornfield   Total critical care time: 30 minutes  Critical care time was exclusive of separately billable procedures and treating other patients.  Critical care was necessary to treat or prevent imminent or life-threatening deterioration.  Critical care was time spent personally by me on the following activities: development of treatment plan with patient and/or surrogate as well as nursing, discussions with consultants, evaluation of patient's response to treatment, examination of patient, obtaining history from patient or surrogate, ordering and performing treatments and interventions, ordering and review of laboratory studies, ordering and review of radiographic studies, pulse oximetry and re-evaluation of patient's condition.    Final Clinical Impressions(s) / ED Diagnoses   Final diagnoses:  Fever  Sepsis ([place])  Chronic obstructive pulmonary disease, unspecified COPD type ([place])  Chronic pain  Essential hypertension  Uncomplicated opioid dependence [facility])    New Prescriptions New Prescriptions   No medications on file     Doristine Devoid, PA-C 08/09/16 [place], MD 08/09/16 [phone]

## 2016-08-09 NOTE — ED Triage Notes (Addendum)
Per EMS , pt. From home with complaint of flu like symptoms which started at 3am this morning. Stated that he has generalized body ache. Denied of SOB, alert, oriented x 4. Denied chest pain. with temp. 101'F  Orally , received 1000mg /dl., of tylenol per EMS.

## 2016-08-10 ENCOUNTER — Inpatient Hospital Stay (HOSPITAL_COMMUNITY): Payer: Medicare Other

## 2016-08-10 DIAGNOSIS — A419 Sepsis, unspecified organism: Secondary | ICD-10-CM

## 2016-08-10 DIAGNOSIS — R6520 Severe sepsis without septic shock: Secondary | ICD-10-CM

## 2016-08-10 LAB — BLOOD CULTURE ID PANEL (REFLEXED)
ACINETOBACTER BAUMANNII: NOT DETECTED
CANDIDA ALBICANS: NOT DETECTED
CANDIDA GLABRATA: NOT DETECTED
CANDIDA TROPICALIS: NOT DETECTED
Candida krusei: NOT DETECTED
Candida parapsilosis: NOT DETECTED
ENTEROBACTER CLOACAE COMPLEX: NOT DETECTED
ENTEROBACTERIACEAE SPECIES: NOT DETECTED
Enterococcus species: NOT DETECTED
Escherichia coli: NOT DETECTED
Haemophilus influenzae: NOT DETECTED
KLEBSIELLA OXYTOCA: NOT DETECTED
KLEBSIELLA PNEUMONIAE: NOT DETECTED
Listeria monocytogenes: NOT DETECTED
METHICILLIN RESISTANCE: NOT DETECTED
NEISSERIA MENINGITIDIS: NOT DETECTED
PSEUDOMONAS AERUGINOSA: NOT DETECTED
Proteus species: NOT DETECTED
STREPTOCOCCUS PNEUMONIAE: NOT DETECTED
STREPTOCOCCUS PYOGENES: NOT DETECTED
STREPTOCOCCUS SPECIES: NOT DETECTED
Serratia marcescens: NOT DETECTED
Staphylococcus aureus (BCID): NOT DETECTED
Staphylococcus species: DETECTED — AB
Streptococcus agalactiae: NOT DETECTED

## 2016-08-10 LAB — COMPREHENSIVE METABOLIC PANEL
ALT: 22 U/L (ref 17–63)
ANION GAP: 7 (ref 5–15)
AST: 16 U/L (ref 15–41)
Albumin: 3.8 g/dL (ref 3.5–5.0)
Alkaline Phosphatase: 64 U/L (ref 38–126)
BILIRUBIN TOTAL: 0.7 mg/dL (ref 0.3–1.2)
BUN: <5 mg/dL — ABNORMAL LOW (ref 6–20)
CO2: 26 mmol/L (ref 22–32)
Calcium: 8.6 mg/dL — ABNORMAL LOW (ref 8.9–10.3)
Chloride: 107 mmol/L (ref 101–111)
Creatinine, Ser: 0.67 mg/dL (ref 0.61–1.24)
Glucose, Bld: 137 mg/dL — ABNORMAL HIGH (ref 65–99)
POTASSIUM: 3.2 mmol/L — AB (ref 3.5–5.1)
Sodium: 140 mmol/L (ref 135–145)
TOTAL PROTEIN: 6.9 g/dL (ref 6.5–8.1)

## 2016-08-10 LAB — URINE CULTURE: CULTURE: NO GROWTH

## 2016-08-10 LAB — DIFFERENTIAL
BASOS ABS: 0 10*3/uL (ref 0.0–0.1)
BASOS PCT: 0 %
Eosinophils Absolute: 0.2 10*3/uL (ref 0.0–0.7)
Eosinophils Relative: 2 %
LYMPHS ABS: 1.7 10*3/uL (ref 0.7–4.0)
Lymphocytes Relative: 18 %
Monocytes Absolute: 1.1 10*3/uL — ABNORMAL HIGH (ref 0.1–1.0)
Monocytes Relative: 12 %
NEUTROS ABS: 6.6 10*3/uL (ref 1.7–7.7)
NEUTROS PCT: 68 %

## 2016-08-10 LAB — CBC
HEMATOCRIT: 35.9 % — AB (ref 39.0–52.0)
HEMOGLOBIN: 12.4 g/dL — AB (ref 13.0–17.0)
MCH: 29.6 pg (ref 26.0–34.0)
MCHC: 34.5 g/dL (ref 30.0–36.0)
MCV: 85.7 fL (ref 78.0–100.0)
Platelets: 211 10*3/uL (ref 150–400)
RBC: 4.19 MIL/uL — ABNORMAL LOW (ref 4.22–5.81)
RDW: 12.9 % (ref 11.5–15.5)
WBC: 9.6 10*3/uL (ref 4.0–10.5)

## 2016-08-10 MED ORDER — POTASSIUM CHLORIDE CRYS ER 20 MEQ PO TBCR
40.0000 meq | EXTENDED_RELEASE_TABLET | Freq: Once | ORAL | Status: AC
  Administered 2016-08-10: 40 meq via ORAL
  Filled 2016-08-10: qty 2

## 2016-08-10 MED ORDER — DEXTROSE 5 % IV SOLN
1.0000 g | Freq: Three times a day (TID) | INTRAVENOUS | Status: DC
  Administered 2016-08-10 – 2016-08-11 (×3): 1 g via INTRAVENOUS
  Filled 2016-08-10 (×4): qty 1

## 2016-08-10 NOTE — Progress Notes (Signed)
Pharmacy Antibiotic Note  [name] is a 41 y.o. male admitted on 08/09/2016 with suspected sepsis of unknown source.  Pt reports rigors, aches, flu-like symptoms.  Fever, leukocytosis, hypotension, and elevated lactic acid noted in ED.  CXR w/o acute cardiopulmonary disease, CT w/o acute findings in the chest, abdomen or pelvis.  Pharmacy was initially consulted for Vancomycin, Levaquin, and Aztreonam dosing.  MD dosing Tamiflu.  Today, 08/10/2016: - Improved to afebrile, WBC improved to WNL, SCr stable, CrCl > 100 - Discussed antibiotic regimen with MD.  Since PCN allergy was childhood reaction w/o sign of anaphylactic response and > 10 years ago, it is reasonable to try Cefepime and d/c Aztreonam and Levaquin. - D/C Tamiflu since Influenza screening is negative - CXR remains w/o acute cardiopulmonary disease - BCx is likely contaminant, but continue Vanc/Cefepime until MD evaluates patient today  Plan:  Cefepime 1g IV q8h  Continue Vancomycin 1g IV q8h.  Measure Vanc trough at steady state if needed.  Follow up renal fxn, culture results, and clinical course.  Height: 5\' 9"  (175.3 cm) Weight: 248 lb 0.3 oz (112.5 kg) IBW/kg (Calculated) : 70.7  Temp (24hrs), Avg:98.4 F (36.9 C), Min:98.3 F (36.8 C), Max:98.6 F (37 C)   Recent Labs Lab 08/09/16 0627 08/09/16 0736 08/09/16 1115 08/09/16 1220 08/10/16 0456  WBC 22.7*  --   --   --  9.6  CREATININE 0.65  --   --   --  0.67  LATICACIDVEN  --  2.21* 1.9 1.9  --     Estimated Creatinine Clearance: 150.2 mL/min (by C-G formula based on SCr of 0.67 mg/dL).    Allergies  Allergen Reactions  . Penicillins     Unknown childhood reaction Has patient had a PCN reaction causing immediate rash, facial/tongue/throat swelling, SOB or lightheadedness with hypotension: unknown Has patient had a PCN reaction causing severe rash involving mucus membranes or skin necrosis: No Has patient had a PCN reaction that required  hospitalization No Has patient had a PCN reaction occurring within the last 10 years: No If all of the above answers are "NO", then may proceed with Cephalosporin use.     Antimicrobials this admission: 9/30 vanc >>  9/30 aztreonam >> 10/1 9/30 levaquin >> 10/1 9/30 Tamiflu >> 10/1 10/1 Cefepime >>  Dose adjustments this admission:  Microbiology results: 9/30 BCx: 1/2 GPC      BCID:  Staph species, no meth resistant (likely oxacillin sens CoNS, contaminant) 9/30 UCx: sent  9/30 Influenza: negative, H1N1 not detected  PHARMACY - PHYSICIAN COMMUNICATION CRITICAL VALUE ALERT - BLOOD CULTURE IDENTIFICATION (BCID)  Results for orders placed or performed during the hospital encounter of 08/09/16  Blood Culture ID Panel (Reflexed) (Collected: 08/09/2016  8:30 AM)  Result Value Ref Range   Enterococcus species NOT DETECTED NOT DETECTED   Listeria monocytogenes NOT DETECTED NOT DETECTED   Staphylococcus species DETECTED (A) NOT DETECTED   Staphylococcus aureus NOT DETECTED NOT DETECTED   Methicillin resistance NOT DETECTED NOT DETECTED   Streptococcus species NOT DETECTED NOT DETECTED   Streptococcus agalactiae NOT DETECTED NOT DETECTED   Streptococcus pneumoniae NOT DETECTED NOT DETECTED   Streptococcus pyogenes NOT DETECTED NOT DETECTED   Acinetobacter baumannii NOT DETECTED NOT DETECTED   Enterobacteriaceae species NOT DETECTED NOT DETECTED   Enterobacter cloacae complex NOT DETECTED NOT DETECTED   Escherichia coli NOT DETECTED NOT DETECTED   Klebsiella oxytoca NOT DETECTED NOT DETECTED   Klebsiella pneumoniae NOT DETECTED NOT DETECTED   Proteus  species NOT DETECTED NOT DETECTED   Serratia marcescens NOT DETECTED NOT DETECTED   Haemophilus influenzae NOT DETECTED NOT DETECTED   Neisseria meningitidis NOT DETECTED NOT DETECTED   Pseudomonas aeruginosa NOT DETECTED NOT DETECTED   Candida albicans NOT DETECTED NOT DETECTED   Candida glabrata NOT DETECTED NOT DETECTED   Candida  krusei NOT DETECTED NOT DETECTED   Candida parapsilosis NOT DETECTED NOT DETECTED   Candida tropicalis NOT DETECTED NOT DETECTED   Name of physician (or Provider) Contacted: Dr. Doyle Askew Changes to prescribed antibiotics required: See changes above.   Thank you for allowing pharmacy to be a part of this patient's care.   Gretta Arab PharmD, BCPS Pager [phone] 08/10/2016 8:18 AM

## 2016-08-10 NOTE — Progress Notes (Signed)
Patient ID: [name], male   DOB: 20-Nov-1975, 40 y.o.   MRN: [id number]    PROGRESS NOTE    [name]  [id number] DOB: October 10, 1976 DOA: 08/09/2016  PCP: Crisoforo Oxford, PA (Inactive)   Brief Narrative:   40 y.o. male who presented to the emergency department with flulike symptoms that started early am prior to this admission. He reported that he woke up with rigors, generalized body aches.  Assessment & Plan:   Principal Problem:   Severe sepsis ([place]) - unclear source, urine analysis and CT abd with no clear source - blood cultures preliminary report with g+cocci - keep on vanc and maxipime for now  - follow up on final cultures   Active Problems:   Hypertension, essential - Stable for now    Current every day smoker - consultation on cessation provided     COPD (chronic obstructive pulmonary disease) (HCC) - stable respiratory status this AM    Chronic pain with Opioid dependence ([place]) - no pain this am     Hypokalemia - supplement, repeat BMP in AM   DVT prophylaxis: Lovenox Sq Code Status: Full  Family Communication: Patient at bedside  Disposition Plan: Home in few days   Consultants:   None  Procedures:   None  Antimicrobials:   Vanc and Maxipime 9/30 -->   Subjective: No events overnight.   Objective: Vitals:   08/09/16 2054 08/10/16 0623 08/10/16 0806 08/10/16 1031  BP: 138/74 (!) 143/87  (!) 142/93  Pulse: 97 84  82  Resp: 20 20    Temp: 98.6 F (37 C) 98.3 F (36.8 C)    TempSrc: Oral Oral    SpO2: 97% 95% 97%   Weight:      Height:        Intake/Output Summary (Last 24 hours) at 08/10/16 1330 Last data filed at 08/10/16 1032  Gross per 24 hour  Intake           3702.5 ml  Output                0 ml  Net           3702.5 ml   Filed Weights   08/09/16 1216  Weight: 112.5 kg (248 lb 0.3 oz)    Examination:  General exam: Appears calm and comfortable  Respiratory system: Clear to auscultation.  Respiratory effort normal. Cardiovascular system: S1 & S2 heard, RRR. No JVD, murmurs, rubs, gallops or clicks. No pedal edema. Gastrointestinal system: Abdomen is nondistended, soft and nontender. No organomegaly or masses felt. Normal bowel sounds heard. Central nervous system: Alert and oriented. No focal neurological deficits.  Data Reviewed: I have personally reviewed following labs and imaging studies  CBC:  Recent Labs Lab 08/09/16 0627 08/10/16 0456  WBC 22.7* 9.6  NEUTROABS 19.4* 6.6  HGB 14.1 12.4*  HCT 39.1 35.9*  MCV 84.4 85.7  PLT 237 [id number]   Basic Metabolic Panel:  Recent Labs Lab 08/09/16 0627 08/10/16 0456  NA 140 140  K 3.2* 3.2*  CL 106 107  CO2 24 26  GLUCOSE 100* 137*  BUN 9 <5*  CREATININE 0.65 0.67  CALCIUM 8.9 8.6*   GFR: Estimated Creatinine Clearance: 150.2 mL/min (by C-G formula based on SCr of 0.67 mg/dL). Liver Function Tests:  Recent Labs Lab 08/09/16 0627 08/10/16 0456  AST 21 16  ALT 28 22  ALKPHOS 74 64  BILITOT 0.9 0.7  PROT 7.5 6.9  ALBUMIN 4.6 3.8  Urine analysis:    Component Value Date/Time   COLORURINE YELLOW 08/09/2016 0702   APPEARANCEUR CLEAR 08/09/2016 0702   LABSPEC 1.011 08/09/2016 0702   PHURINE 7.0 08/09/2016 0702   GLUCOSEU NEGATIVE 08/09/2016 0702   HGBUR TRACE (A) 08/09/2016 0702   BILIRUBINUR NEGATIVE 08/09/2016 0702   KETONESUR NEGATIVE 08/09/2016 0702   PROTEINUR NEGATIVE 08/09/2016 0702   UROBILINOGEN 0.2 11/16/2008 1136   NITRITE NEGATIVE 08/09/2016 0702   LEUKOCYTESUR NEGATIVE 08/09/2016 [id number]   Recent Results (from the past 240 hour(s))  Urine culture     Status: None   Collection Time: 08/09/16  7:00 AM  Result Value Ref Range Status   Specimen Description URINE, CLEAN CATCH  Final   Special Requests NONE  Final   Culture NO GROWTH Performed at [facility]   Final   Report Status 08/10/2016 FINAL  Final  Blood Culture (routine x 2)     Status: None (Preliminary result)    Collection Time: 08/09/16  8:30 AM  Result Value Ref Range Status   Specimen Description BLOOD RIGHT ARM  Final   Special Requests BOTTLES DRAWN AEROBIC AND ANAEROBIC 4CC  Final   Culture  Setup Time   Final    AEROBIC BOTTLE ONLY GRAM POSITIVE COCCI IN CLUSTERS CRITICAL RESULT CALLED TO, READ BACK BY AND VERIFIED WITH: C SHADE,PHARMD AT 0815 08/10/16 BY L BENFIELD Performed at [place]  Final   Report Status PENDING  Incomplete  Blood Culture ID Panel (Reflexed)     Status: Abnormal   Collection Time: 08/09/16  8:30 AM  Result Value Ref Range Status   Enterococcus species NOT DETECTED NOT DETECTED Final   Listeria monocytogenes NOT DETECTED NOT DETECTED Final   Staphylococcus species DETECTED (A) NOT DETECTED Final    Comment: CRITICAL RESULT CALLED TO, READ BACK BY AND VERIFIED WITH: C SHADE,PHARMD AT 0815 08/10/16 BY L BENFIELD    Staphylococcus aureus NOT DETECTED NOT DETECTED Final   Methicillin resistance NOT DETECTED NOT DETECTED Final   Streptococcus species NOT DETECTED NOT DETECTED Final   Streptococcus agalactiae NOT DETECTED NOT DETECTED Final   Streptococcus pneumoniae NOT DETECTED NOT DETECTED Final   Streptococcus pyogenes NOT DETECTED NOT DETECTED Final   Acinetobacter baumannii NOT DETECTED NOT DETECTED Final   Enterobacteriaceae species NOT DETECTED NOT DETECTED Final   Enterobacter cloacae complex NOT DETECTED NOT DETECTED Final   Escherichia coli NOT DETECTED NOT DETECTED Final   Klebsiella oxytoca NOT DETECTED NOT DETECTED Final   Klebsiella pneumoniae NOT DETECTED NOT DETECTED Final   Proteus species NOT DETECTED NOT DETECTED Final   Serratia marcescens NOT DETECTED NOT DETECTED Final   Haemophilus influenzae NOT DETECTED NOT DETECTED Final   Neisseria meningitidis NOT DETECTED NOT DETECTED Final   Pseudomonas aeruginosa NOT DETECTED NOT DETECTED Final   Candida albicans NOT DETECTED NOT DETECTED Final   Candida  glabrata NOT DETECTED NOT DETECTED Final   Candida krusei NOT DETECTED NOT DETECTED Final   Candida parapsilosis NOT DETECTED NOT DETECTED Final   Candida tropicalis NOT DETECTED NOT DETECTED Final    Comment: Performed at [facility]  Blood Culture (routine x 2)     Status: None (Preliminary result)   Collection Time: 08/09/16  8:50 AM  Result Value Ref Range Status   Specimen Description BLOOD LEFT ARM  Final   Special Requests BOTTLES DRAWN AEROBIC AND ANAEROBIC 5CC  Final   Culture PENDING  Incomplete  Report Status PENDING  Incomplete      Radiology Studies: X-ray Chest Pa And Lateral  Result Date: 08/10/2016 CLINICAL DATA:  40 year old male with cough and fever for 2 days. Initial encounter. EXAM: CHEST  2 VIEW COMPARISON:  Chest CT 08/09/2016, and earlier. FINDINGS: Lung volumes are stable and within normal limits. Mediastinal contours are stable and within normal limits. No pneumothorax, pulmonary edema, pleural effusion or consolidation. No definite acute pulmonary opacity. Chronic ACDF hardware re - demonstrated. Mildly increased bony sclerosis diffusely again noted. No acute or destructive osseous lesion identified. IMPRESSION: No acute cardiopulmonary abnormality. Electronically Signed   By: Genevie Ann M.D.   On: 08/10/2016 08:10   Dg Chest 2 View  Result Date: 08/09/2016 CLINICAL DATA:  Pt complains of SOB, chills, cough, congestion, sweats since 3 am. Hx of COPD, HTN and an ex-smoker. EXAM: CHEST  2 VIEW COMPARISON:  11/08/2014 FINDINGS: The heart size and mediastinal contours are within normal limits. Both lungs are clear. No pleural effusion or pneumothorax. Stable changes from a previous anterior cervical spine fusion. Bony thorax is intact. IMPRESSION: No active cardiopulmonary disease. Electronically Signed   By: Lajean Manes M.D.   On: 08/09/2016 07:31   Ct Chest W Contrast  Result Date: 08/09/2016 CLINICAL DATA:  40 year old male with history of flu-like symptoms  since 3 a.m. this morning. Generalized body aches. EXAM: CT CHEST, ABDOMEN, AND PELVIS WITH CONTRAST TECHNIQUE: Multidetector CT imaging of the chest, abdomen and pelvis was performed following the standard protocol during bolus administration of intravenous contrast. CONTRAST:  153mL ISOVUE-300 IOPAMIDOL (ISOVUE-300) INJECTION 61% COMPARISON:  No priors. FINDINGS: CT CHEST FINDINGS Cardiovascular: Heart size is normal. There is no significant pericardial fluid, thickening or pericardial calcification. Mediastinum/Nodes: No pathologically enlarged mediastinal or hilar lymph nodes. Esophagus is unremarkable in appearance. No axillary lymphadenopathy. Lungs/Pleura: No acute consolidative airspace disease. No pleural effusions. No suspicious appearing pulmonary nodules or masses. Areas of mild peripheral scarring are noted throughout the lung bases bilaterally. Musculoskeletal: Orthopedic fixation hardware is noted in the lower cervical spine. There are no aggressive appearing lytic or blastic lesions noted in the visualized portions of the skeleton. CT ABDOMEN PELVIS FINDINGS Hepatobiliary: No cystic or solid hepatic lesions. No intra or extrahepatic biliary ductal dilatation. Status post cholecystectomy. Pancreas: No pancreatic mass. No pancreatic ductal dilatation. No pancreatic or peripancreatic fluid or inflammatory changes. Spleen: Unremarkable. Adrenals/Urinary Tract: No suspicious renal lesions. Bilateral kidneys and adrenal glands are normal in appearance. No hydroureteronephrosis. Urinary bladder is normal in appearance. Stomach/Bowel: Normal appearance of the stomach. No pathologic dilatation of small bowel or colon. Normal appendix. Vascular/Lymphatic: No significant atherosclerotic disease, aneurysm or dissection identified in the abdominal or pelvic vasculature. No lymphadenopathy noted in the abdomen or pelvis. There are numerous nonenlarged lymph nodes throughout the root of the small bowel mesenteries  and in the retroperitoneum, which are greater than typically seen, but nonspecific. Reproductive: Prostate gland and seminal vesicles are unremarkable in appearance. Other: No significant volume of ascites.  No pneumoperitoneum. Musculoskeletal: There are no aggressive appearing lytic or blastic lesions noted in the visualized portions of the skeleton. IMPRESSION: 1. No acute findings in the chest, abdomen or pelvis to account for the patient's symptoms. 2. Normal appendix. Electronically Signed   By: Vinnie Langton M.D.   On: 08/09/2016 11:51   Ct Abdomen Pelvis W Contrast  Result Date: 08/09/2016 CLINICAL DATA:  40 year old male with history of flu-like symptoms since 3 a.m. this morning. Generalized body aches. EXAM: CT  CHEST, ABDOMEN, AND PELVIS WITH CONTRAST TECHNIQUE: Multidetector CT imaging of the chest, abdomen and pelvis was performed following the standard protocol during bolus administration of intravenous contrast. CONTRAST:  178mL ISOVUE-300 IOPAMIDOL (ISOVUE-300) INJECTION 61% COMPARISON:  No priors. FINDINGS: CT CHEST FINDINGS Cardiovascular: Heart size is normal. There is no significant pericardial fluid, thickening or pericardial calcification. Mediastinum/Nodes: No pathologically enlarged mediastinal or hilar lymph nodes. Esophagus is unremarkable in appearance. No axillary lymphadenopathy. Lungs/Pleura: No acute consolidative airspace disease. No pleural effusions. No suspicious appearing pulmonary nodules or masses. Areas of mild peripheral scarring are noted throughout the lung bases bilaterally. Musculoskeletal: Orthopedic fixation hardware is noted in the lower cervical spine. There are no aggressive appearing lytic or blastic lesions noted in the visualized portions of the skeleton. CT ABDOMEN PELVIS FINDINGS Hepatobiliary: No cystic or solid hepatic lesions. No intra or extrahepatic biliary ductal dilatation. Status post cholecystectomy. Pancreas: No pancreatic mass. No pancreatic  ductal dilatation. No pancreatic or peripancreatic fluid or inflammatory changes. Spleen: Unremarkable. Adrenals/Urinary Tract: No suspicious renal lesions. Bilateral kidneys and adrenal glands are normal in appearance. No hydroureteronephrosis. Urinary bladder is normal in appearance. Stomach/Bowel: Normal appearance of the stomach. No pathologic dilatation of small bowel or colon. Normal appendix. Vascular/Lymphatic: No significant atherosclerotic disease, aneurysm or dissection identified in the abdominal or pelvic vasculature. No lymphadenopathy noted in the abdomen or pelvis. There are numerous nonenlarged lymph nodes throughout the root of the small bowel mesenteries and in the retroperitoneum, which are greater than typically seen, but nonspecific. Reproductive: Prostate gland and seminal vesicles are unremarkable in appearance. Other: No significant volume of ascites.  No pneumoperitoneum. Musculoskeletal: There are no aggressive appearing lytic or blastic lesions noted in the visualized portions of the skeleton. IMPRESSION: 1. No acute findings in the chest, abdomen or pelvis to account for the patient's symptoms. 2. Normal appendix. Electronically Signed   By: Vinnie Langton M.D.   On: 08/09/2016 11:51      Scheduled Meds: . amLODipine  10 mg Oral Daily  . baclofen  10 mg Oral TID  . benazepril  10 mg Oral Daily   And  . hydrochlorothiazide  12.5 mg Oral Daily  . ceFEPime (MAXIPIME) IV  1 g Intravenous Q8H  . docusate sodium  100 mg Oral BID  . enoxaparin (LOVENOX) injection  40 mg Subcutaneous Q24H  . gabapentin  600 mg Oral QID  . pantoprazole  40 mg Oral Daily  . sertraline  100 mg Oral Daily  . sodium chloride flush  3 mL Intravenous Q12H  . tiotropium  18 mcg Inhalation Daily  . vancomycin  1,000 mg Intravenous Q8H   Continuous Infusions:    LOS: 1 day    Time spent: 20 minutes    Faye Ramsay, MD [facility] Pager [phone]  If 7PM-7AM, please  contact night-coverage [email or web address] Password [email or web address] 08/10/2016, 1:30 PM

## 2016-08-10 NOTE — Progress Notes (Signed)
Patient ID: DAVIER DOHMEN, male   DOB: 1976-08-18, 40 y.o.   MRN: [id number]    PROGRESS NOTE    HAMPTON RHEW  [id number] DOB: 05/09/1976 DOA: 08/09/2016  PCP: Crisoforo Oxford, PA (Inactive)   Brief Narrative:   40 y.o. male who presented to the emergency department with flulike symptoms that started early am prior to this admission. He reported that he woke up with rigors, generalized body aches.  Assessment & Plan:   Principal Problem:   Severe sepsis ([place]) - unclear source, urine analysis and CT abd with no clear source - blood cultures preliminary report with g+cocci - keep on vanc and maxipime for now, plan to transition to PO in AM if no fevers - follow up on final cultures   Active Problems:   Hypertension, essential - Stable for now    Current every day smoker - consultation on cessation provided     COPD (chronic obstructive pulmonary disease) (HCC) - stable respiratory status this AM    Chronic pain with Opioid dependence ([place]) - no pain this am     Hypokalemia - supplement, repeat BMP in AM   DVT prophylaxis: Lovenox Sq Code Status: Full  Family Communication: Patient at bedside  Disposition Plan: Home in few days   Consultants:   None  Procedures:   None  Antimicrobials:   Vanc and Maxipime 9/30 -->   Subjective: No events overnight.   Objective: Vitals:   08/09/16 2054 08/10/16 0623 08/10/16 0806 08/10/16 1031  BP: 138/74 (!) 143/87  (!) 142/93  Pulse: 97 84  82  Resp: 20 20    Temp: 98.6 F (37 C) 98.3 F (36.8 C)    TempSrc: Oral Oral    SpO2: 97% 95% 97%   Weight:      Height:        Intake/Output Summary (Last 24 hours) at 08/10/16 1400 Last data filed at 08/10/16 1032  Gross per 24 hour  Intake           3702.5 ml  Output                0 ml  Net           3702.5 ml   Filed Weights   08/09/16 1216  Weight: 112.5 kg (248 lb 0.3 oz)    Examination:  General exam: Appears calm and comfortable    Respiratory system: Clear to auscultation. Respiratory effort normal. Cardiovascular system: S1 & S2 heard, RRR. No JVD, murmurs, rubs, gallops or clicks. No pedal edema. Gastrointestinal system: Abdomen is nondistended, soft and nontender. No organomegaly or masses felt. Normal bowel sounds heard. Central nervous system: Alert and oriented. No focal neurological deficits.  Data Reviewed: I have personally reviewed following labs and imaging studies  CBC:  Recent Labs Lab 08/09/16 0627 08/10/16 0456  WBC 22.7* 9.6  NEUTROABS 19.4* 6.6  HGB 14.1 12.4*  HCT 39.1 35.9*  MCV 84.4 85.7  PLT 237 [id number]   Basic Metabolic Panel:  Recent Labs Lab 08/09/16 0627 08/10/16 0456  NA 140 140  K 3.2* 3.2*  CL 106 107  CO2 24 26  GLUCOSE 100* 137*  BUN 9 <5*  CREATININE 0.65 0.67  CALCIUM 8.9 8.6*   GFR: Estimated Creatinine Clearance: 150.2 mL/min (by C-G formula based on SCr of 0.67 mg/dL). Liver Function Tests:  Recent Labs Lab 08/09/16 0627 08/10/16 0456  AST 21 16  ALT 28 22  ALKPHOS 74 64  BILITOT 0.9  0.7  PROT 7.5 6.9  ALBUMIN 4.6 3.8   Urine analysis:    Component Value Date/Time   COLORURINE YELLOW 08/09/2016 0702   APPEARANCEUR CLEAR 08/09/2016 0702   LABSPEC 1.011 08/09/2016 0702   PHURINE 7.0 08/09/2016 0702   GLUCOSEU NEGATIVE 08/09/2016 0702   HGBUR TRACE (A) 08/09/2016 0702   BILIRUBINUR NEGATIVE 08/09/2016 0702   KETONESUR NEGATIVE 08/09/2016 0702   PROTEINUR NEGATIVE 08/09/2016 0702   UROBILINOGEN 0.2 11/16/2008 1136   NITRITE NEGATIVE 08/09/2016 0702   LEUKOCYTESUR NEGATIVE 08/09/2016 [id number]   Recent Results (from the past 240 hour(s))  Urine culture     Status: None   Collection Time: 08/09/16  7:00 AM  Result Value Ref Range Status   Specimen Description URINE, CLEAN CATCH  Final   Special Requests NONE  Final   Culture NO GROWTH Performed at [facility]   Final   Report Status 08/10/2016 FINAL  Final  Blood Culture (routine x 2)      Status: None (Preliminary result)   Collection Time: 08/09/16  8:30 AM  Result Value Ref Range Status   Specimen Description BLOOD RIGHT ARM  Final   Special Requests BOTTLES DRAWN AEROBIC AND ANAEROBIC 4CC  Final   Culture  Setup Time   Final    AEROBIC BOTTLE ONLY GRAM POSITIVE COCCI IN CLUSTERS CRITICAL RESULT CALLED TO, READ BACK BY AND VERIFIED WITH: C SHADE,PHARMD AT 0815 08/10/16 BY L BENFIELD Performed at [place]  Final   Report Status PENDING  Incomplete  Blood Culture ID Panel (Reflexed)     Status: Abnormal   Collection Time: 08/09/16  8:30 AM  Result Value Ref Range Status   Enterococcus species NOT DETECTED NOT DETECTED Final   Listeria monocytogenes NOT DETECTED NOT DETECTED Final   Staphylococcus species DETECTED (A) NOT DETECTED Final    Comment: CRITICAL RESULT CALLED TO, READ BACK BY AND VERIFIED WITH: C SHADE,PHARMD AT 0815 08/10/16 BY L BENFIELD    Staphylococcus aureus NOT DETECTED NOT DETECTED Final   Methicillin resistance NOT DETECTED NOT DETECTED Final   Streptococcus species NOT DETECTED NOT DETECTED Final   Streptococcus agalactiae NOT DETECTED NOT DETECTED Final   Streptococcus pneumoniae NOT DETECTED NOT DETECTED Final   Streptococcus pyogenes NOT DETECTED NOT DETECTED Final   Acinetobacter baumannii NOT DETECTED NOT DETECTED Final   Enterobacteriaceae species NOT DETECTED NOT DETECTED Final   Enterobacter cloacae complex NOT DETECTED NOT DETECTED Final   Escherichia coli NOT DETECTED NOT DETECTED Final   Klebsiella oxytoca NOT DETECTED NOT DETECTED Final   Klebsiella pneumoniae NOT DETECTED NOT DETECTED Final   Proteus species NOT DETECTED NOT DETECTED Final   Serratia marcescens NOT DETECTED NOT DETECTED Final   Haemophilus influenzae NOT DETECTED NOT DETECTED Final   Neisseria meningitidis NOT DETECTED NOT DETECTED Final   Pseudomonas aeruginosa NOT DETECTED NOT DETECTED Final   Candida albicans NOT  DETECTED NOT DETECTED Final   Candida glabrata NOT DETECTED NOT DETECTED Final   Candida krusei NOT DETECTED NOT DETECTED Final   Candida parapsilosis NOT DETECTED NOT DETECTED Final   Candida tropicalis NOT DETECTED NOT DETECTED Final    Comment: Performed at [facility]  Blood Culture (routine x 2)     Status: None (Preliminary result)   Collection Time: 08/09/16  8:50 AM  Result Value Ref Range Status   Specimen Description BLOOD LEFT ARM  Final   Special Requests BOTTLES DRAWN AEROBIC AND  ANAEROBIC 5CC  Final   Culture PENDING  Incomplete   Report Status PENDING  Incomplete      Radiology Studies: X-ray Chest Pa And Lateral  Result Date: 08/10/2016 CLINICAL DATA:  40 year old male with cough and fever for 2 days. Initial encounter. EXAM: CHEST  2 VIEW COMPARISON:  Chest CT 08/09/2016, and earlier. FINDINGS: Lung volumes are stable and within normal limits. Mediastinal contours are stable and within normal limits. No pneumothorax, pulmonary edema, pleural effusion or consolidation. No definite acute pulmonary opacity. Chronic ACDF hardware re - demonstrated. Mildly increased bony sclerosis diffusely again noted. No acute or destructive osseous lesion identified. IMPRESSION: No acute cardiopulmonary abnormality. Electronically Signed   By: Genevie Ann M.D.   On: 08/10/2016 08:10   Dg Chest 2 View  Result Date: 08/09/2016 CLINICAL DATA:  Pt complains of SOB, chills, cough, congestion, sweats since 3 am. Hx of COPD, HTN and an ex-smoker. EXAM: CHEST  2 VIEW COMPARISON:  11/08/2014 FINDINGS: The heart size and mediastinal contours are within normal limits. Both lungs are clear. No pleural effusion or pneumothorax. Stable changes from a previous anterior cervical spine fusion. Bony thorax is intact. IMPRESSION: No active cardiopulmonary disease. Electronically Signed   By: Lajean Manes M.D.   On: 08/09/2016 07:31   Ct Chest W Contrast  Result Date: 08/09/2016 CLINICAL DATA:  40 year old  male with history of flu-like symptoms since 3 a.m. this morning. Generalized body aches. EXAM: CT CHEST, ABDOMEN, AND PELVIS WITH CONTRAST TECHNIQUE: Multidetector CT imaging of the chest, abdomen and pelvis was performed following the standard protocol during bolus administration of intravenous contrast. CONTRAST:  156mL ISOVUE-300 IOPAMIDOL (ISOVUE-300) INJECTION 61% COMPARISON:  No priors. FINDINGS: CT CHEST FINDINGS Cardiovascular: Heart size is normal. There is no significant pericardial fluid, thickening or pericardial calcification. Mediastinum/Nodes: No pathologically enlarged mediastinal or hilar lymph nodes. Esophagus is unremarkable in appearance. No axillary lymphadenopathy. Lungs/Pleura: No acute consolidative airspace disease. No pleural effusions. No suspicious appearing pulmonary nodules or masses. Areas of mild peripheral scarring are noted throughout the lung bases bilaterally. Musculoskeletal: Orthopedic fixation hardware is noted in the lower cervical spine. There are no aggressive appearing lytic or blastic lesions noted in the visualized portions of the skeleton. CT ABDOMEN PELVIS FINDINGS Hepatobiliary: No cystic or solid hepatic lesions. No intra or extrahepatic biliary ductal dilatation. Status post cholecystectomy. Pancreas: No pancreatic mass. No pancreatic ductal dilatation. No pancreatic or peripancreatic fluid or inflammatory changes. Spleen: Unremarkable. Adrenals/Urinary Tract: No suspicious renal lesions. Bilateral kidneys and adrenal glands are normal in appearance. No hydroureteronephrosis. Urinary bladder is normal in appearance. Stomach/Bowel: Normal appearance of the stomach. No pathologic dilatation of small bowel or colon. Normal appendix. Vascular/Lymphatic: No significant atherosclerotic disease, aneurysm or dissection identified in the abdominal or pelvic vasculature. No lymphadenopathy noted in the abdomen or pelvis. There are numerous nonenlarged lymph nodes throughout  the root of the small bowel mesenteries and in the retroperitoneum, which are greater than typically seen, but nonspecific. Reproductive: Prostate gland and seminal vesicles are unremarkable in appearance. Other: No significant volume of ascites.  No pneumoperitoneum. Musculoskeletal: There are no aggressive appearing lytic or blastic lesions noted in the visualized portions of the skeleton. IMPRESSION: 1. No acute findings in the chest, abdomen or pelvis to account for the patient's symptoms. 2. Normal appendix. Electronically Signed   By: Vinnie Langton M.D.   On: 08/09/2016 11:51   Ct Abdomen Pelvis W Contrast  Result Date: 08/09/2016 CLINICAL DATA:  40 year old male with history of  flu-like symptoms since 3 a.m. this morning. Generalized body aches. EXAM: CT CHEST, ABDOMEN, AND PELVIS WITH CONTRAST TECHNIQUE: Multidetector CT imaging of the chest, abdomen and pelvis was performed following the standard protocol during bolus administration of intravenous contrast. CONTRAST:  132mL ISOVUE-300 IOPAMIDOL (ISOVUE-300) INJECTION 61% COMPARISON:  No priors. FINDINGS: CT CHEST FINDINGS Cardiovascular: Heart size is normal. There is no significant pericardial fluid, thickening or pericardial calcification. Mediastinum/Nodes: No pathologically enlarged mediastinal or hilar lymph nodes. Esophagus is unremarkable in appearance. No axillary lymphadenopathy. Lungs/Pleura: No acute consolidative airspace disease. No pleural effusions. No suspicious appearing pulmonary nodules or masses. Areas of mild peripheral scarring are noted throughout the lung bases bilaterally. Musculoskeletal: Orthopedic fixation hardware is noted in the lower cervical spine. There are no aggressive appearing lytic or blastic lesions noted in the visualized portions of the skeleton. CT ABDOMEN PELVIS FINDINGS Hepatobiliary: No cystic or solid hepatic lesions. No intra or extrahepatic biliary ductal dilatation. Status post cholecystectomy.  Pancreas: No pancreatic mass. No pancreatic ductal dilatation. No pancreatic or peripancreatic fluid or inflammatory changes. Spleen: Unremarkable. Adrenals/Urinary Tract: No suspicious renal lesions. Bilateral kidneys and adrenal glands are normal in appearance. No hydroureteronephrosis. Urinary bladder is normal in appearance. Stomach/Bowel: Normal appearance of the stomach. No pathologic dilatation of small bowel or colon. Normal appendix. Vascular/Lymphatic: No significant atherosclerotic disease, aneurysm or dissection identified in the abdominal or pelvic vasculature. No lymphadenopathy noted in the abdomen or pelvis. There are numerous nonenlarged lymph nodes throughout the root of the small bowel mesenteries and in the retroperitoneum, which are greater than typically seen, but nonspecific. Reproductive: Prostate gland and seminal vesicles are unremarkable in appearance. Other: No significant volume of ascites.  No pneumoperitoneum. Musculoskeletal: There are no aggressive appearing lytic or blastic lesions noted in the visualized portions of the skeleton. IMPRESSION: 1. No acute findings in the chest, abdomen or pelvis to account for the patient's symptoms. 2. Normal appendix. Electronically Signed   By: Vinnie Langton M.D.   On: 08/09/2016 11:51      Scheduled Meds: . amLODipine  10 mg Oral Daily  . baclofen  10 mg Oral TID  . benazepril  10 mg Oral Daily   And  . hydrochlorothiazide  12.5 mg Oral Daily  . ceFEPime (MAXIPIME) IV  1 g Intravenous Q8H  . docusate sodium  100 mg Oral BID  . enoxaparin (LOVENOX) injection  40 mg Subcutaneous Q24H  . gabapentin  600 mg Oral QID  . pantoprazole  40 mg Oral Daily  . potassium chloride  40 mEq Oral Once  . sertraline  100 mg Oral Daily  . sodium chloride flush  3 mL Intravenous Q12H  . tiotropium  18 mcg Inhalation Daily  . vancomycin  1,000 mg Intravenous Q8H   Continuous Infusions:    LOS: 1 day    Time spent: 20 minutes     Faye Ramsay, MD [facility] Pager [phone]  If 7PM-7AM, please contact night-coverage [email or web address] Password [email or web address] 08/10/2016, 2:00 PM

## 2016-08-11 DIAGNOSIS — F17200 Nicotine dependence, unspecified, uncomplicated: Secondary | ICD-10-CM

## 2016-08-11 LAB — CBC
HEMATOCRIT: 37.7 % — AB (ref 39.0–52.0)
Hemoglobin: 12.7 g/dL — ABNORMAL LOW (ref 13.0–17.0)
MCH: 29.6 pg (ref 26.0–34.0)
MCHC: 33.7 g/dL (ref 30.0–36.0)
MCV: 87.9 fL (ref 78.0–100.0)
Platelets: 232 10*3/uL (ref 150–400)
RBC: 4.29 MIL/uL (ref 4.22–5.81)
RDW: 13.1 % (ref 11.5–15.5)
WBC: 7.4 10*3/uL (ref 4.0–10.5)

## 2016-08-11 LAB — BASIC METABOLIC PANEL
ANION GAP: 9 (ref 5–15)
BUN: 8 mg/dL (ref 6–20)
CALCIUM: 8.9 mg/dL (ref 8.9–10.3)
CO2: 26 mmol/L (ref 22–32)
Chloride: 107 mmol/L (ref 101–111)
Creatinine, Ser: 0.68 mg/dL (ref 0.61–1.24)
GFR calc Af Amer: >60 mL/min (ref >60)
GFR calc non Af Amer: >60 mL/min (ref >60)
GLUCOSE: 107 mg/dL — AB (ref 65–99)
Potassium: 3.3 mmol/L — ABNORMAL LOW (ref 3.5–5.1)
Sodium: 142 mmol/L (ref 135–145)

## 2016-08-11 MED ORDER — POTASSIUM CHLORIDE CRYS ER 20 MEQ PO TBCR
40.0000 meq | EXTENDED_RELEASE_TABLET | Freq: Once | ORAL | Status: AC
  Administered 2016-08-11: 40 meq via ORAL
  Filled 2016-08-11: qty 2

## 2016-08-11 MED ORDER — POTASSIUM CHLORIDE CRYS ER 20 MEQ PO TBCR: 20.0000 meq | EXTENDED_RELEASE_TABLET | Freq: Once | ORAL | 0 refills | Status: DC

## 2016-08-11 NOTE — Discharge Summary (Signed)
Physician Discharge Summary  [name] [id number] DOB: June 18, 1976 DOA: 08/09/2016  PCP: Crisoforo Oxford, PA (Inactive)  Admit date: 08/09/2016 Discharge date: 08/11/2016  Recommendations for Outpatient Follow-up:  1. Pt will need to follow up with PCP in 2-3 weeks post discharge 2. Please obtain BMP to evaluate electrolytes and kidney function  Discharge Diagnoses:  Principal Problem:   Severe sepsis ([place]) Active Problems:   Hypertension   Fever, unknown origin  Discharge Condition: Stable  Diet recommendation: Heart healthy diet discussed in details   Brief Narrative:  40 y.o.malewho presented to the emergency department with flulike symptoms that started early am prior to this admission. He reported that he woke up with rigors, generalized body aches.  Assessment & Plan:   Principal Problem:   Severe sepsis ([place]) - ruled out, blood cultures consistent with contamination  - suspect component of dehydration and possible viral illness  - ABX d/c as pt no fevers, no leukocytosis, feels good, wants to go home   Active Problems:   Hypertension, essential - Stable for now, resume home medical regimne     Current every day smoker - consultation on cessation provided     COPD (chronic obstructive pulmonary disease) (HCC) - stable respiratory status this AM    Chronic pain with Opioid dependence ([place]) - no pain this am     Hypokalemia - supplemented prior to discharge    DVT prophylaxis: Lovenox Sq Code Status: Full  Family Communication: Patient at bedside  Disposition Plan: Home   Consultants:   None  Procedures:   None  Antimicrobials:   Vanc and Maxipime 9/30 --> discontinued    Procedures/Studies: X-ray Chest Pa And Lateral  Result Date: 08/10/2016 CLINICAL DATA:  40 year old male with cough and fever for 2 days. Initial encounter. EXAM: CHEST  2 VIEW COMPARISON:  Chest CT 08/09/2016, and earlier. FINDINGS: Lung volumes  are stable and within normal limits. Mediastinal contours are stable and within normal limits. No pneumothorax, pulmonary edema, pleural effusion or consolidation. No definite acute pulmonary opacity. Chronic ACDF hardware re - demonstrated. Mildly increased bony sclerosis diffusely again noted. No acute or destructive osseous lesion identified. IMPRESSION: No acute cardiopulmonary abnormality. Electronically Signed   By: Genevie Ann M.D.   On: 08/10/2016 08:10   Dg Chest 2 View  Result Date: 08/09/2016 CLINICAL DATA:  Pt complains of SOB, chills, cough, congestion, sweats since 3 am. Hx of COPD, HTN and an ex-smoker. EXAM: CHEST  2 VIEW COMPARISON:  11/08/2014 FINDINGS: The heart size and mediastinal contours are within normal limits. Both lungs are clear. No pleural effusion or pneumothorax. Stable changes from a previous anterior cervical spine fusion. Bony thorax is intact. IMPRESSION: No active cardiopulmonary disease. Electronically Signed   By: Lajean Manes M.D.   On: 08/09/2016 07:31   Ct Chest W Contrast  Result Date: 08/09/2016 CLINICAL DATA:  40 year old male with history of flu-like symptoms since 3 a.m. this morning. Generalized body aches. EXAM: CT CHEST, ABDOMEN, AND PELVIS WITH CONTRAST TECHNIQUE: Multidetector CT imaging of the chest, abdomen and pelvis was performed following the standard protocol during bolus administration of intravenous contrast. CONTRAST:  163mL ISOVUE-300 IOPAMIDOL (ISOVUE-300) INJECTION 61% COMPARISON:  No priors. FINDINGS: CT CHEST FINDINGS Cardiovascular: Heart size is normal. There is no significant pericardial fluid, thickening or pericardial calcification. Mediastinum/Nodes: No pathologically enlarged mediastinal or hilar lymph nodes. Esophagus is unremarkable in appearance. No axillary lymphadenopathy. Lungs/Pleura: No acute consolidative airspace disease. No pleural effusions. No suspicious appearing pulmonary nodules  or masses. Areas of mild peripheral scarring  are noted throughout the lung bases bilaterally. Musculoskeletal: Orthopedic fixation hardware is noted in the lower cervical spine. There are no aggressive appearing lytic or blastic lesions noted in the visualized portions of the skeleton. CT ABDOMEN PELVIS FINDINGS Hepatobiliary: No cystic or solid hepatic lesions. No intra or extrahepatic biliary ductal dilatation. Status post cholecystectomy. Pancreas: No pancreatic mass. No pancreatic ductal dilatation. No pancreatic or peripancreatic fluid or inflammatory changes. Spleen: Unremarkable. Adrenals/Urinary Tract: No suspicious renal lesions. Bilateral kidneys and adrenal glands are normal in appearance. No hydroureteronephrosis. Urinary bladder is normal in appearance. Stomach/Bowel: Normal appearance of the stomach. No pathologic dilatation of small bowel or colon. Normal appendix. Vascular/Lymphatic: No significant atherosclerotic disease, aneurysm or dissection identified in the abdominal or pelvic vasculature. No lymphadenopathy noted in the abdomen or pelvis. There are numerous nonenlarged lymph nodes throughout the root of the small bowel mesenteries and in the retroperitoneum, which are greater than typically seen, but nonspecific. Reproductive: Prostate gland and seminal vesicles are unremarkable in appearance. Other: No significant volume of ascites.  No pneumoperitoneum. Musculoskeletal: There are no aggressive appearing lytic or blastic lesions noted in the visualized portions of the skeleton. IMPRESSION: 1. No acute findings in the chest, abdomen or pelvis to account for the patient's symptoms. 2. Normal appendix. Electronically Signed   By: Vinnie Langton M.D.   On: 08/09/2016 11:51   Ct Abdomen Pelvis W Contrast  Result Date: 08/09/2016 CLINICAL DATA:  40 year old male with history of flu-like symptoms since 3 a.m. this morning. Generalized body aches. EXAM: CT CHEST, ABDOMEN, AND PELVIS WITH CONTRAST TECHNIQUE: Multidetector CT imaging of the  chest, abdomen and pelvis was performed following the standard protocol during bolus administration of intravenous contrast. CONTRAST:  192mL ISOVUE-300 IOPAMIDOL (ISOVUE-300) INJECTION 61% COMPARISON:  No priors. FINDINGS: CT CHEST FINDINGS Cardiovascular: Heart size is normal. There is no significant pericardial fluid, thickening or pericardial calcification. Mediastinum/Nodes: No pathologically enlarged mediastinal or hilar lymph nodes. Esophagus is unremarkable in appearance. No axillary lymphadenopathy. Lungs/Pleura: No acute consolidative airspace disease. No pleural effusions. No suspicious appearing pulmonary nodules or masses. Areas of mild peripheral scarring are noted throughout the lung bases bilaterally. Musculoskeletal: Orthopedic fixation hardware is noted in the lower cervical spine. There are no aggressive appearing lytic or blastic lesions noted in the visualized portions of the skeleton. CT ABDOMEN PELVIS FINDINGS Hepatobiliary: No cystic or solid hepatic lesions. No intra or extrahepatic biliary ductal dilatation. Status post cholecystectomy. Pancreas: No pancreatic mass. No pancreatic ductal dilatation. No pancreatic or peripancreatic fluid or inflammatory changes. Spleen: Unremarkable. Adrenals/Urinary Tract: No suspicious renal lesions. Bilateral kidneys and adrenal glands are normal in appearance. No hydroureteronephrosis. Urinary bladder is normal in appearance. Stomach/Bowel: Normal appearance of the stomach. No pathologic dilatation of small bowel or colon. Normal appendix. Vascular/Lymphatic: No significant atherosclerotic disease, aneurysm or dissection identified in the abdominal or pelvic vasculature. No lymphadenopathy noted in the abdomen or pelvis. There are numerous nonenlarged lymph nodes throughout the root of the small bowel mesenteries and in the retroperitoneum, which are greater than typically seen, but nonspecific. Reproductive: Prostate gland and seminal vesicles are  unremarkable in appearance. Other: No significant volume of ascites.  No pneumoperitoneum. Musculoskeletal: There are no aggressive appearing lytic or blastic lesions noted in the visualized portions of the skeleton. IMPRESSION: 1. No acute findings in the chest, abdomen or pelvis to account for the patient's symptoms. 2. Normal appendix. Electronically Signed   By: Mauri Brooklyn.D.  On: 08/09/2016 11:51    Discharge Exam: Vitals:   08/11/16 0641 08/11/16 0917  BP: 136/78 (!) 142/90  Pulse: 75   Resp: 20   Temp: 98.3 F (36.8 C)    Vitals:   08/10/16 2133 08/11/16 0641 08/11/16 0808 08/11/16 0917  BP: (!) 135/94 136/78  (!) 142/90  Pulse: 70 75    Resp: 20 20    Temp: 98.1 F (36.7 C) 98.3 F (36.8 C)    TempSrc: Oral Oral    SpO2: 97% 96% 97%   Weight:      Height:        General: Pt is alert, follows commands appropriately, not in acute distress Cardiovascular: Regular rate and rhythm, S1/S2 +, no murmurs, no rubs, no gallops Respiratory: Clear to auscultation bilaterally, no wheezing, no crackles, no rhonchi Abdominal: Soft, non tender, non distended, bowel sounds +, no guarding Extremities: no edema, no cyanosis, pulses palpable bilaterally DP and PT Neuro: Grossly nonfocal  Discharge Instructions  Discharge Instructions    Diet - low sodium heart healthy    Complete by:  As directed    Increase activity slowly    Complete by:  As directed        Medication List    TAKE these medications   albuterol 108 (90 Base) MCG/ACT inhaler Commonly known as:  PROVENTIL HFA;VENTOLIN HFA Inhale 2 puffs into the lungs every 6 (six) hours as needed for wheezing or shortness of breath.   amLODipine 10 MG tablet Commonly known as:  NORVASC Take 10 mg by mouth daily.   baclofen 10 MG tablet Commonly known as:  LIORESAL Take 10 mg by mouth 3 (three) times daily.   benazepril-hydrochlorthiazide 10-12.5 MG tablet Commonly known as:  LOTENSIN HCT Take 1 tablet by mouth  daily.   diclofenac 75 MG EC tablet Commonly known as:  VOLTAREN Take 75 mg by mouth daily.   gabapentin 600 MG tablet Commonly known as:  NEURONTIN Take 600 mg by mouth 4 (four) times daily.   hydroxypropyl methylcellulose / hypromellose 2.5 % ophthalmic solution Commonly known as:  ISOPTO TEARS / GONIOVISC Place 1 drop into both eyes 3 (three) times daily as needed for dry eyes.   ibuprofen 200 MG tablet Commonly known as:  ADVIL,MOTRIN Take 800 mg by mouth every 6 (six) hours as needed for moderate pain.   omeprazole 20 MG capsule Commonly known as:  PRILOSEC Take 20 mg by mouth daily.   oxymorphone 5 MG tablet Commonly known as:  OPANA Take 5 mg by mouth 4 (four) times daily.   potassium chloride SA 20 MEQ tablet Commonly known as:  K-DUR,KLOR-CON Take 1 tablet (20 mEq total) by mouth once.   sertraline 100 MG tablet Commonly known as:  ZOLOFT Take 100 mg by mouth daily.   SPIRIVA HANDIHALER 18 MCG inhalation capsule Generic drug:  tiotropium Place 18 mcg into inhaler and inhale daily.      Follow-up Information    [place], [place] .   Contact information: [place] [address] [phone]        Faye Ramsay, MD .   Specialty:  Internal Medicine Contact information: [address] [place] [address] [phone]            The results of significant diagnostics from this hospitalization (including imaging, microbiology, ancillary and laboratory) are listed below for reference.     Microbiology: Recent Results (from the past 240 hour(s))  Urine culture  Status: None   Collection Time: 08/09/16  7:00 AM  Result Value Ref Range Status   Specimen Description URINE, CLEAN CATCH  Final   Special Requests NONE  Final   Culture NO GROWTH Performed at [facility]   Final   Report Status 08/10/2016 FINAL  Final  Blood Culture (routine x 2)     Status: None (Preliminary result)    Collection Time: 08/09/16  8:30 AM  Result Value Ref Range Status   Specimen Description BLOOD RIGHT ARM  Final   Special Requests BOTTLES DRAWN AEROBIC AND ANAEROBIC 4CC  Final   Culture  Setup Time   Final    AEROBIC BOTTLE ONLY GRAM POSITIVE COCCI IN CLUSTERS CRITICAL RESULT CALLED TO, READ BACK BY AND VERIFIED WITH: C SHADE,PHARMD AT 0815 08/10/16 BY L BENFIELD Performed at [place]  Final   Report Status PENDING  Incomplete  Blood Culture ID Panel (Reflexed)     Status: Abnormal   Collection Time: 08/09/16  8:30 AM  Result Value Ref Range Status   Enterococcus species NOT DETECTED NOT DETECTED Final   Listeria monocytogenes NOT DETECTED NOT DETECTED Final   Staphylococcus species DETECTED (A) NOT DETECTED Final    Comment: CRITICAL RESULT CALLED TO, READ BACK BY AND VERIFIED WITH: C SHADE,PHARMD AT 0815 08/10/16 BY L BENFIELD    Staphylococcus aureus NOT DETECTED NOT DETECTED Final   Methicillin resistance NOT DETECTED NOT DETECTED Final   Streptococcus species NOT DETECTED NOT DETECTED Final   Streptococcus agalactiae NOT DETECTED NOT DETECTED Final   Streptococcus pneumoniae NOT DETECTED NOT DETECTED Final   Streptococcus pyogenes NOT DETECTED NOT DETECTED Final   Acinetobacter baumannii NOT DETECTED NOT DETECTED Final   Enterobacteriaceae species NOT DETECTED NOT DETECTED Final   Enterobacter cloacae complex NOT DETECTED NOT DETECTED Final   Escherichia coli NOT DETECTED NOT DETECTED Final   Klebsiella oxytoca NOT DETECTED NOT DETECTED Final   Klebsiella pneumoniae NOT DETECTED NOT DETECTED Final   Proteus species NOT DETECTED NOT DETECTED Final   Serratia marcescens NOT DETECTED NOT DETECTED Final   Haemophilus influenzae NOT DETECTED NOT DETECTED Final   Neisseria meningitidis NOT DETECTED NOT DETECTED Final   Pseudomonas aeruginosa NOT DETECTED NOT DETECTED Final   Candida albicans NOT DETECTED NOT DETECTED Final   Candida  glabrata NOT DETECTED NOT DETECTED Final   Candida krusei NOT DETECTED NOT DETECTED Final   Candida parapsilosis NOT DETECTED NOT DETECTED Final   Candida tropicalis NOT DETECTED NOT DETECTED Final    Comment: Performed at [facility]  Blood Culture (routine x 2)     Status: None (Preliminary result)   Collection Time: 08/09/16  8:50 AM  Result Value Ref Range Status   Specimen Description BLOOD LEFT ARM  Final   Special Requests BOTTLES DRAWN AEROBIC AND ANAEROBIC 5CC  Final   Culture   Final    NO GROWTH 1 DAY Performed at [facility]    Report Status PENDING  Incomplete     Labs: Basic Metabolic Panel:  Recent Labs Lab 08/09/16 0627 08/10/16 0456 08/11/16 0348  NA 140 140 142  K 3.2* 3.2* 3.3*  CL 106 107 107  CO2 24 26 26   GLUCOSE 100* 137* 107*  BUN 9 <5* 8  CREATININE 0.65 0.67 0.68  CALCIUM 8.9 8.6* 8.9   Liver Function Tests:  Recent Labs Lab 08/09/16 0627 08/10/16 0456  AST 21 16  ALT 28  22  ALKPHOS 74 64  BILITOT 0.9 0.7  PROT 7.5 6.9  ALBUMIN 4.6 3.8   CBC:  Recent Labs Lab 08/09/16 0627 08/10/16 0456 08/11/16 0348  WBC 22.7* 9.6 7.4  NEUTROABS 19.4* 6.6  --   HGB 14.1 12.4* 12.7*  HCT 39.1 35.9* 37.7*  MCV 84.4 85.7 87.9  PLT 237 211 232    SIGNED: Time coordinating discharge:  30 minutes  MAGICK-Andreas Sobolewski, MD  [facility] 08/11/2016, 11:18 AM Pager [phone]  If 7PM-7AM, please contact night-coverage [email or web address] Password [email or web address]

## 2016-08-11 NOTE — Progress Notes (Signed)
Patient discharged home with brother, discharge instructions given and explained to patient and she verbalized understanding, denies any pain/distress. Accompanied home by sister. No wound noted, skin intact.

## 2016-08-11 NOTE — Discharge Instructions (Signed)
Leukocytosis Leukocytosis means you have more white blood cells than normal. White blood cells are made in your bone marrow. The main job of white blood cells is to fight infection. Having too many white blood cells is a common condition. It can develop as a result of many types of medical problems. CAUSES  In some cases, your bone marrow may be normal, but it is still making too many white blood cells. This could be the result of:  Infection.  Injury.  Physical stress.  Emotional stress.  Surgery.  Allergic reactions.  Tumors that do not start in the blood or bone marrow.  An inherited disease.  Certain medicines.  Pregnancy and labor. In other cases, you may have a bone marrow disorder that is causing your body to make too many white blood cells. Bone marrow disorders include:  Leukemia. This is a type of blood cancer.  Myeloproliferative disorders. These disorders cause blood cells to grow abnormally. SYMPTOMS  Some people have no symptoms. Others have symptoms due to the medical problem that is causing their leukocytosis. These symptoms may include:  Bleeding.  Bruising.  Fever.  Night sweats.  Repeated infections.  Weakness.  Weight loss. DIAGNOSIS  Leukocytosis is often found during blood tests that are done as part of a normal physical exam. Your caregiver will probably order other tests to help determine why you have too many white blood cells. These tests may include:  A complete blood count (CBC). This test measures all the types of blood cells in your body.  Chest X-rays, urine tests (urinalysis), or other tests to look for signs of infection.  Bone marrow aspiration. For this test, a needle is put into your bone. Cells from the bone marrow are removed through the needle. The cells are then examined under a microscope. TREATMENT  Treatment is usually not needed for leukocytosis. However, if a disorder is causing your leukocytosis, it will need to be  treated. Treatment may include:  Antibiotic medicines if you have a bacterial infection.  Bone marrow transplant. Your diseased bone marrow is replaced with healthy cells that will grow new bone marrow.  Chemotherapy. This is the use of drugs to kill cancer cells. HOME CARE INSTRUCTIONS  Only take over-the-counter or prescription medicines as directed by your caregiver.  Maintain a healthy weight. Ask your caregiver what weight is best for you.  Eat foods that are low in saturated fats and high in fiber. Eat plenty of fruits and vegetables.  Drink enough fluids to keep your urine clear or pale yellow.  Get 30 minutes of exercise at least 5 times a week. Check with your caregiver before starting a new exercise routine.  Limit caffeine and alcohol.  Do not smoke.  Keep all follow-up appointments as directed by your caregiver. SEEK MEDICAL CARE IF:  You feel weak or more tired than usual.  You develop chills, a cough, or nasal congestion.  You lose weight without trying.  You have night sweats.  You bruise easily. SEEK IMMEDIATE MEDICAL CARE IF:  You bleed more than normal.  You have chest pain.  You have trouble breathing.  You have a fever.  You have uncontrolled nausea or vomiting.  You feel dizzy or lightheaded. MAKE SURE YOU:  Understand these instructions.  Will watch your condition.  Will get help right away if you are not doing well or get worse.   This information is not intended to replace advice given to you by your health care provider.  Make sure you discuss any questions you have with your health care provider.   Document Released: 10/16/2011 Document Revised: 01/19/2012 Document Reviewed: 04/30/2015 Elsevier Interactive Patient Education [employer].

## 2016-08-11 NOTE — Care Management Note (Signed)
Case Management Note  Patient Details  Name: MORRY BAZIL MRN: [id number] Date of Birth: 1976/06/07  Subjective/Objective: 40 y/o m admitted w/Severe sepsis. From home. No CM needs.                   Action/Plan:d/c home.   Expected Discharge Date:                 Expected Discharge Plan:  Home/Self Care  In-House Referral:     Discharge planning Services  CM Consult  Post Acute Care Choice:    Choice offered to:     DME Arranged:    DME Agency:     HH Arranged:    [place] Agency:     Status of Service:  Completed, signed off  If discussed at [employer] of Stay Meetings, dates discussed:    Additional Comments:  Dessa Phi, RN 08/11/2016, 12:02 PM

## 2016-08-12 LAB — CULTURE, BLOOD (ROUTINE X 2)

## 2016-08-14 LAB — CULTURE, BLOOD (ROUTINE X 2): Culture: NO GROWTH

## 2016-09-11 DIAGNOSIS — M25549 Pain in joints of unspecified hand: Secondary | ICD-10-CM | POA: Insufficient documentation

## 2016-12-27 ENCOUNTER — Emergency Department (HOSPITAL_COMMUNITY)
Admission: EM | Admit: 2016-12-27 | Discharge: 2016-12-27 | Disposition: A | Discharge status: Home / Self Care | Payer: Medicare Other | Attending: Emergency Medicine | Admitting: Emergency Medicine

## 2016-12-27 ENCOUNTER — Encounter (HOSPITAL_COMMUNITY): Payer: Self-pay | Admitting: Emergency Medicine

## 2016-12-27 DIAGNOSIS — I10 Essential (primary) hypertension: Secondary | ICD-10-CM | POA: Insufficient documentation

## 2016-12-27 DIAGNOSIS — K0889 Other specified disorders of teeth and supporting structures: Secondary | ICD-10-CM | POA: Diagnosis present

## 2016-12-27 DIAGNOSIS — K029 Dental caries, unspecified: Secondary | ICD-10-CM | POA: Diagnosis not present

## 2016-12-27 DIAGNOSIS — J449 Chronic obstructive pulmonary disease, unspecified: Secondary | ICD-10-CM | POA: Insufficient documentation

## 2016-12-27 DIAGNOSIS — F17210 Nicotine dependence, cigarettes, uncomplicated: Secondary | ICD-10-CM | POA: Insufficient documentation

## 2016-12-27 MED ORDER — PENICILLIN V POTASSIUM 500 MG PO TABS: 500.0000 mg | ORAL_TABLET | Freq: Three times a day (TID) | ORAL | 0 refills | Status: DC

## 2016-12-27 NOTE — ED Triage Notes (Signed)
Patient c/o left upper dental pain that started yesterday. Patient to have oral surgery to get teeth extracted on Friday was prescribed clindamycin for abscessed tooth in which he was unable to tolerate, prescribed another antibiotic (unsure of name) which he finished. Patient states pain started shortly after finishing antibiotic. Denies fever. Per patient pain controled with Advil and prescribed pain medication.

## 2016-12-27 NOTE — ED Provider Notes (Signed)
[facility] DEPT Provider Note   CSN: [id number] Arrival date & time: 12/27/16  0757     History   Chief Complaint Chief Complaint  Patient presents with  . Dental Pain    HPI [name] is a 41 y.o. male.  HPI   [name] is a 41 y.o. male who presents to the Emergency Department complaining of dental pain and swelling of the his gums.  He states that he recently finished an unknown abx for dental infection and he is scheduled to have his left upper "back tooth" extracted next Friday.  He reports a sharp, stabbing pain to his tooth and left face for several days but worsened last evening.  He has been taking ibuprofen and his prescribed pain medication with moderate relief.  He noticed swelling of the surrounding gum this morning.  He denies neck pain, fever, chills, facial swelling and difficulty opening or closing his mouth,.    Past Medical History:  Diagnosis Date  . Chronic pain   . COPD (chronic obstructive pulmonary disease) ([place])   . Current every day smoker   . High blood pressure   . Hypoglycemia   . Opioid dependence [facility])     Patient Active Problem List   Diagnosis Date Noted  . Fever, unknown origin 08/09/2016  . Severe sepsis ([place]) 08/09/2016  . Rigors 08/09/2016  . Current every day smoker 08/09/2016  . Hypokalemia 08/09/2016  . COPD (chronic obstructive pulmonary disease) ([place])   . Chronic pain   . High blood pressure   . Opioid dependence ([place])   . Speech abnormality 11/08/2014  . Hypertension 11/08/2014  . Spinal stenosis in cervical region 04/30/2011  . Shoulder pain 04/30/2011  . INGROWN NAIL 12/18/2010  . SHOULDER PAIN 12/18/2010  . IMPINGEMENT SYNDROME 12/18/2010  . LATERAL EPICONDYLITIS 12/18/2010    Past Surgical History:  Procedure Laterality Date  . BACK SURGERY    . EYE SURGERY         Home Medications    Prior to Admission medications   Medication Sig Start Date End Date Taking? Authorizing Provider    albuterol (PROVENTIL HFA;VENTOLIN HFA) 108 (90 BASE) MCG/ACT inhaler Inhale 2 puffs into the lungs every 6 (six) hours as needed for wheezing or shortness of breath.    Historical Provider, MD  amLODipine (NORVASC) 10 MG tablet Take 10 mg by mouth daily. 07/29/16   Historical Provider, MD  baclofen (LIORESAL) 10 MG tablet Take 10 mg by mouth 3 (three) times daily. 08/04/16   Historical Provider, MD  benazepril-hydrochlorthiazide (LOTENSIN HCT) 10-12.5 MG tablet Take 1 tablet by mouth daily. 07/29/16   Historical Provider, MD  diclofenac (VOLTAREN) 75 MG EC tablet Take 75 mg by mouth daily. 07/29/16   Historical Provider, MD  gabapentin (NEURONTIN) 600 MG tablet Take 600 mg by mouth 4 (four) times daily. 08/04/16   Historical Provider, MD  hydroxypropyl methylcellulose / hypromellose (ISOPTO TEARS / GONIOVISC) 2.5 % ophthalmic solution Place 1 drop into both eyes 3 (three) times daily as needed for dry eyes.    Historical Provider, MD  ibuprofen (ADVIL,MOTRIN) 200 MG tablet Take 800 mg by mouth every 6 (six) hours as needed for moderate pain.    Historical Provider, MD  omeprazole (PRILOSEC) 20 MG capsule Take 20 mg by mouth daily. 07/29/16   Historical Provider, MD  oxymorphone (OPANA) 5 MG tablet Take 5 mg by mouth 4 (four) times daily.  09/27/14   Historical Provider, MD  potassium chloride SA (  K-DUR,KLOR-CON) 20 MEQ tablet Take 1 tablet (20 mEq total) by mouth once. 08/11/16 08/11/16  Theodis Blaze, MD  sertraline (ZOLOFT) 100 MG tablet Take 100 mg by mouth daily.      Historical Provider, MD  SPIRIVA HANDIHALER 18 MCG inhalation capsule Place 18 mcg into inhaler and inhale daily. 08/04/16   Historical Provider, MD    Family History No family history on file.  Social History Social History  Substance Use Topics  . Smoking status: Current Every Day Smoker    Packs/day: 2.00    Years: 21.00    Types: Cigarettes  . Smokeless tobacco: Never Used  . Alcohol use No     Allergies    Penicillins   Review of Systems Review of Systems  Constitutional: Negative for appetite change and fever.  HENT: Positive for dental problem. Negative for congestion, facial swelling, sore throat and trouble swallowing.   Eyes: Negative for pain and visual disturbance.  Musculoskeletal: Negative for neck pain and neck stiffness.  Neurological: Negative for dizziness, facial asymmetry and headaches.  Hematological: Negative for adenopathy.  All other systems reviewed and are negative.    Physical Exam Updated Vital Signs BP 159/92 (BP Location: Left Arm)   Pulse 89   Temp 98.2 F (36.8 C) (Oral)   Resp 18   Ht 5\' 9"  (1.753 m)   Wt 112.9 kg   SpO2 97%   BMI 36.77 kg/m   Physical Exam  Constitutional: He is oriented to person, place, and time. He appears well-developed and well-nourished. No distress.  HENT:  Head: Normocephalic and atraumatic.  Right Ear: Tympanic membrane and ear canal normal.  Left Ear: Tympanic membrane and ear canal normal.  Mouth/Throat: Uvula is midline, oropharynx is clear and moist and mucous membranes are normal. No trismus in the jaw. Dental caries present. No dental abscesses or uvula swelling.  tenderness and dental caries of the left upper second molar, tooth is partially avulsed.  Moderate edema of the surrounding gingiva.  No facial swelling, obvious dental abscess, trismus, or sublingual abnml.    Neck: Normal range of motion. Neck supple.  Cardiovascular: Normal rate, regular rhythm and normal heart sounds.   No murmur heard. Pulmonary/Chest: Effort normal and breath sounds normal.  Musculoskeletal: Normal range of motion.  Lymphadenopathy:    He has no cervical adenopathy.  Neurological: He is alert and oriented to person, place, and time. He exhibits normal muscle tone. Coordination normal.  Skin: Skin is warm and dry.  Nursing note and vitals reviewed.    ED Treatments / Results  Labs (all labs ordered are listed, but only  abnormal results are displayed) Labs Reviewed - No data to display  EKG  EKG Interpretation None       Radiology No results found.  Procedures Procedures (including critical care time)  Medications Ordered in ED Medications - No data to display   Initial Impression / Assessment and Plan / ED Course  I have reviewed the triage vital signs and the nursing notes.  Pertinent labs & imaging results that were available during my care of the patient were reviewed by me and considered in my medical decision making (see chart for details).     Pt well appearing.  Vitals stable.  No concerning sx's for dental abscess or Ludwig's angina. Has an appt next week with oral surgeon for scheduled extraction.  Pt prescribed PCN, stated to me that his only rxn to PCN was as a child it made his  gums bleed.  Denies hives, swelling or SOB or known intolerance.  Advised to also take benadryl and return if he develops rxn.   Pt reviewed on the [place], received #120 oxymorphone 5 mg on 12/01/16  Final Clinical Impressions(s) / ED Diagnoses   Final diagnoses:  Pain, dental    New Prescriptions New Prescriptions   No medications on file     Bufford Lope 12/27/16 [place], MD 12/27/16 [phone]

## 2016-12-27 NOTE — Discharge Instructions (Signed)
Keep your dental appt for next week.  Warm salt water rinses.

## 2017-01-22 DIAGNOSIS — M79642 Pain in left hand: Secondary | ICD-10-CM | POA: Insufficient documentation

## 2017-01-22 DIAGNOSIS — M79641 Pain in right hand: Secondary | ICD-10-CM | POA: Insufficient documentation

## 2017-01-22 DIAGNOSIS — G5621 Lesion of ulnar nerve, right upper limb: Secondary | ICD-10-CM | POA: Insufficient documentation

## 2017-03-20 ENCOUNTER — Ambulatory Visit: Payer: Medicare Other | Admitting: Cardiovascular Disease

## 2017-04-07 ENCOUNTER — Ambulatory Visit: Payer: Medicare Other | Admitting: Nutrition

## 2017-04-07 ENCOUNTER — Telehealth: Payer: Self-pay | Admitting: Nutrition

## 2017-04-07 NOTE — Telephone Encounter (Signed)
Unable to leave VM to reschedule canceled appt.

## 2017-04-08 ENCOUNTER — Ambulatory Visit: Payer: Medicare Other | Admitting: Cardiovascular Disease

## 2017-04-15 ENCOUNTER — Encounter: Payer: Self-pay | Admitting: Cardiovascular Disease

## 2017-04-15 ENCOUNTER — Ambulatory Visit (INDEPENDENT_AMBULATORY_CARE_PROVIDER_SITE_OTHER): Payer: Medicare Other | Admitting: Cardiovascular Disease

## 2017-04-15 VITALS — BP 124/85 | HR 88 | Ht 69.0 in | Wt 256.0 lb

## 2017-04-15 DIAGNOSIS — R002 Palpitations: Secondary | ICD-10-CM

## 2017-04-15 DIAGNOSIS — R55 Syncope and collapse: Secondary | ICD-10-CM | POA: Diagnosis not present

## 2017-04-15 DIAGNOSIS — I10 Essential (primary) hypertension: Secondary | ICD-10-CM | POA: Diagnosis not present

## 2017-04-15 DIAGNOSIS — R079 Chest pain, unspecified: Secondary | ICD-10-CM

## 2017-04-15 DIAGNOSIS — Z8279 Family history of other congenital malformations, deformations and chromosomal abnormalities: Secondary | ICD-10-CM

## 2017-04-15 DIAGNOSIS — G4730 Sleep apnea, unspecified: Secondary | ICD-10-CM

## 2017-04-15 NOTE — Progress Notes (Signed)
CARDIOLOGY CONSULT NOTE  Patient ID: [name] MRN: [id number] DOB/AGE: Nov 01, 1976 41 y.o.  Admit date: (Not on file) Primary Physician: Patient, No Pcp Per (had been Dr. Wenda Overland) Referring Physician: Wenda Overland  Reason for Consultation: Near syncope  HPI: [name] is a 41 y.o. male who is being seen today for the evaluation of near syncope at the request of Celedonio Savage, MD.   The patient is a 41 year old male. I reviewed labs performed on 09/02/16 which showed BUN 16, creatinine 0.71, sodium 137, potassium 4.4. Labs on 03/04/17 showed BUN 11, creatinine 0.62, sodium 137, potassium 3.9, normal N-terminal proBNP. Lipids on 06/17/16 showed total cholesterol 171, HDL 24, LDL 85, triglycerides 309.  He was seen by Dr. Wenda Overland on 03/06/17. He reportedly has a history of anxiety, depression, PTSD, chronic back pain, hypertension, and mild COPD.  I personally reviewed an ECG performed on 08/10/16 which showed sinus rhythm with right bundle branch block and left anterior fascicular block.  He tells me he experiences palpitations about once every 2 months which causes significant lightheadedness to the point where he feels like he is going to pass out. While he denies exertional chest pain, he has had chest pain at rest which she says "has brought me to my knees ". He occasionally has left arm pain but it is not associated with chest pain. 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  He has occasional anxiety attacks.  He has insomnia and stopped caffeine but this did not alleviate his symptoms of chest pain.  He smokes 2 packs of cigarettes daily.  Chest CT on 08/09/16 showed no coronary artery calcifications.  He says he has a history of snoring and he has occasionally stopped breathing in his sleep (witnessed events).  He is soon going to have his teeth pulled.  Social history: His father is also my patient.  Family history: Mother had valve replacement surgery for what was deemed  a congenital abnormality.   Allergies  Allergen Reactions  . Clindamycin/Lincomycin Nausea Only    "feel funny in head"  . Penicillins      "made my gums bleed as a child" Has patient had a PCN reaction causing immediate rash, facial/tongue/throat swelling, SOB or lightheadedness with hypotension: no Has patient had a PCN reaction causing severe rash involving mucus membranes or skin necrosis: No Has patient had a PCN reaction that required hospitalization No Has patient had a PCN reaction occurring within the last 10 years: No If all of the above answers are "NO", then may proceed with Cephalosporin use.     Current Outpatient Prescriptions  Medication Sig Dispense Refill  . amLODipine (NORVASC) 10 MG tablet Take 10 mg by mouth daily.    . baclofen (LIORESAL) 10 MG tablet Take 10 mg by mouth 3 (three) times daily.    . benazepril-hydrochlorthiazide (LOTENSIN HCT) 10-12.5 MG tablet Take 1 tablet by mouth daily.    Marland Kitchen gabapentin (NEURONTIN) 600 MG tablet Take 600 mg by mouth 4 (four) times daily.    Marland Kitchen ibuprofen (ADVIL,MOTRIN) 200 MG tablet Take 800 mg by mouth every 6 (six) hours as needed for moderate pain.    Marland Kitchen omeprazole (PRILOSEC) 20 MG capsule Take 20 mg by mouth daily.    Marland Kitchen oxymorphone (OPANA) 5 MG tablet Take 5 mg by mouth 4 (four) times daily.   0  . SPIRIVA HANDIHALER 18 MCG inhalation capsule Place 18 mcg into inhaler and inhale daily.    Marland Kitchen  potassium chloride SA (K-DUR,KLOR-CON) 20 MEQ tablet Take 1 tablet (20 mEq total) by mouth once. 3 tablet 0   No current facility-administered medications for this visit.     Past Medical History:  Diagnosis Date  . Chronic pain   . COPD (chronic obstructive pulmonary disease) ([place])   . Current every day smoker   . High blood pressure   . Hypoglycemia   . Opioid dependence ([place])     Past Surgical History:  Procedure Laterality Date  . BACK SURGERY    . EYE SURGERY      Social History   Social History  . Marital status:  Legally Separated    Spouse name: N/A  . Number of children: N/A  . Years of education: N/A   Occupational History  . retired./disabled    Social History Main Topics  . Smoking status: Current Every Day Smoker    Packs/day: 2.00    Years: 21.00    Types: Cigarettes  . Smokeless tobacco: Never Used  . Alcohol use No  . Drug use: No  . Sexual activity: Yes   Other Topics Concern  . Not on file   Social History Narrative  . No narrative on file      Current Meds  Medication Sig  . amLODipine (NORVASC) 10 MG tablet Take 10 mg by mouth daily.  . baclofen (LIORESAL) 10 MG tablet Take 10 mg by mouth 3 (three) times daily.  . benazepril-hydrochlorthiazide (LOTENSIN HCT) 10-12.5 MG tablet Take 1 tablet by mouth daily.  Marland Kitchen gabapentin (NEURONTIN) 600 MG tablet Take 600 mg by mouth 4 (four) times daily.  Marland Kitchen ibuprofen (ADVIL,MOTRIN) 200 MG tablet Take 800 mg by mouth every 6 (six) hours as needed for moderate pain.  Marland Kitchen omeprazole (PRILOSEC) 20 MG capsule Take 20 mg by mouth daily.  Marland Kitchen oxymorphone (OPANA) 5 MG tablet Take 5 mg by mouth 4 (four) times daily.   Marland Kitchen SPIRIVA HANDIHALER 18 MCG inhalation capsule Place 18 mcg into inhaler and inhale daily.      Review of systems complete and found to be negative unless listed above in HPI    Physical exam Blood pressure 124/85, pulse 88, height 5\' 9"  (1.753 m), weight 256 lb (116.1 kg), SpO2 96 %. General: NAD Neck: No JVD, no thyromegaly or thyroid nodule.  Lungs: Clear to auscultation bilaterally with normal respiratory effort. CV: Nondisplaced PMI. Regular rate and rhythm, normal S1/S2, no S3/S4, no murmur.  No peripheral edema.  No carotid bruit.    Abdomen: Soft, nontender, obese. Skin: Intact without lesions or rashes.  Neurologic: Alert and oriented x 3.  Psych: Normal affect. Extremities: No clubbing or cyanosis.  HEENT: Poor dentition.  ECG: Most recent ECG reviewed.   Labs: Lab Results  Component Value Date/Time   K 3.3  (L) 08/11/2016 03:48 AM   BUN 8 08/11/2016 03:48 AM   CREATININE 0.68 08/11/2016 03:48 AM   ALT 22 08/10/2016 04:56 AM   HGB 12.7 (L) 08/11/2016 03:48 AM     Lipids: No results found for: LDLCALC, LDLDIRECT, CHOL, TRIG, HDL      ASSESSMENT AND PLAN:  1. Chest pain and near syncope: I will order a 2-D echocardiogram with Doppler to evaluate cardiac structure, function, and regional wall motion. I will proceed with a nuclear myocardial perfusion imaging study to evaluate for ischemic heart disease (Lexiscan Myoview).  2. History of bicuspid aortic valve in mother: I will obtain an echocardiogram to evaluate the aortic valve.  3.  Hypertension: Controlled. No changes to therapy.   Disposition: Follow up in 3 months  Signed: Kate Sable, M.D., F.A.C.C.  04/15/2017, 1:40 PM

## 2017-04-15 NOTE — Patient Instructions (Signed)
Medication Instructions:  Your physician recommends that you continue on your current medications as directed. Please refer to the Current Medication list given to you today.  Labwork: NONE  Testing/Procedures: Your physician has requested that you have an echocardiogram. Echocardiography is a painless test that uses sound waves to create images of your heart. It provides your doctor with information about the size and shape of your heart and how well your heart's chambers and valves are working. This procedure takes approximately one hour. There are no restrictions for this procedure.  Your physician has requested that you have a lexiscan myoview. For further information please visit [email or web address]. Please follow instruction sheet, as given.  Your physician has recommended that you have a sleep study. This test records several body functions during sleep, including: brain activity, eye movement, oxygen and carbon dioxide blood levels, heart rate and rhythm, breathing rate and rhythm, the flow of air through your mouth and nose, snoring, body muscle movements, and chest and belly movement.   Follow-Up: Your physician recommends that you schedule a follow-up appointment in: 3 MONTHS WITH DR. Bronson Ing   Any Other Special Instructions Will Be Listed Below (If Applicable).  If you need a refill on your cardiac medications before your next appointment, please call your pharmacy.

## 2017-04-15 NOTE — Addendum Note (Signed)
Addended by: Acquanetta Chain on: 04/15/2017 02:05 PM   Modules accepted: Orders

## 2017-04-17 ENCOUNTER — Telehealth: Payer: Self-pay | Admitting: Cardiovascular Disease

## 2017-04-17 NOTE — Telephone Encounter (Signed)
Pre-cert Verification for the following procedure   lEXISCAN & ECHO scheduled for 05/26/17 at [facility]

## 2017-04-21 DIAGNOSIS — Z8639 Personal history of other endocrine, nutritional and metabolic disease: Secondary | ICD-10-CM | POA: Diagnosis not present

## 2017-04-21 DIAGNOSIS — L0291 Cutaneous abscess, unspecified: Secondary | ICD-10-CM | POA: Diagnosis not present

## 2017-04-21 DIAGNOSIS — I10 Essential (primary) hypertension: Secondary | ICD-10-CM | POA: Diagnosis not present

## 2017-04-22 DIAGNOSIS — M542 Cervicalgia: Secondary | ICD-10-CM | POA: Diagnosis not present

## 2017-04-22 DIAGNOSIS — I10 Essential (primary) hypertension: Secondary | ICD-10-CM | POA: Diagnosis not present

## 2017-04-22 DIAGNOSIS — M545 Low back pain: Secondary | ICD-10-CM | POA: Diagnosis not present

## 2017-04-27 ENCOUNTER — Encounter: Payer: Medicare Other | Attending: Family Medicine | Admitting: Nutrition

## 2017-04-27 VITALS — Ht 69.0 in | Wt 259.0 lb

## 2017-04-27 DIAGNOSIS — E663 Overweight: Secondary | ICD-10-CM | POA: Diagnosis not present

## 2017-04-27 DIAGNOSIS — E669 Obesity, unspecified: Secondary | ICD-10-CM

## 2017-04-27 DIAGNOSIS — E118 Type 2 diabetes mellitus with unspecified complications: Secondary | ICD-10-CM | POA: Insufficient documentation

## 2017-04-27 DIAGNOSIS — E1165 Type 2 diabetes mellitus with hyperglycemia: Secondary | ICD-10-CM | POA: Insufficient documentation

## 2017-04-27 DIAGNOSIS — IMO0002 Reserved for concepts with insufficient information to code with codable children: Secondary | ICD-10-CM

## 2017-04-27 DIAGNOSIS — Z713 Dietary counseling and surveillance: Secondary | ICD-10-CM | POA: Diagnosis not present

## 2017-04-27 DIAGNOSIS — I10 Essential (primary) hypertension: Secondary | ICD-10-CM | POA: Insufficient documentation

## 2017-04-27 NOTE — Patient Instructions (Addendum)
Goals 1. Follow My Plate 2. Cut out sweet tea and sodas 3. Eat three meals day 4. Avoid skip meals 5. Increase fresh fruits and vegetables Lose 1 lb per week

## 2017-04-28 NOTE — Progress Notes (Signed)
Diabetes Self-Management Education  Visit Type: First/Initial  Appt. Start Time: 1100  Appt. End Time: 1200 04/28/2017  [name], identified by name and date of birth, is a 41 y.o. male with a diagnosis of Diabetes: Type 2.   ASSESSMENT  Height 5\' 9"  (1.753 m), weight 259 lb (117.5 kg). Body mass index is 38.25 kg/m.      Diabetes Self-Management Education - 04/27/17 1105      Visit Information   Visit Type First/Initial     Initial Visit   Diabetes Type Type 2   Are you currently following a meal plan? No   Are you taking your medications as prescribed? Not on Medications   Date Diagnosed JUme 2018     Health Coping   How would you rate your overall health? Good     Psychosocial Assessment   Patient Belief/Attitude about Diabetes Motivated to manage diabetes   Self-care barriers None   Self-management support Family   Other persons present Patient   Patient Concerns Nutrition/Meal planning;Medication;Monitoring;Healthy Lifestyle;Problem Solving;Glycemic Control;Weight Control   Special Needs None   Preferred Learning Style Visual;Hands on   Learning Readiness Change in progress   How often do you need to have someone help you when you read instructions, pamphlets, or other written materials from your doctor or pharmacy? 1 - Never   What is the last grade level you completed in school? 12     Pre-Education Assessment   Patient understands the diabetes disease and treatment process. Needs Instruction   Patient understands incorporating nutritional management into lifestyle. Needs Instruction   Patient undertands incorporating physical activity into lifestyle. Needs Instruction   Patient understands using medications safely. Needs Instruction   Patient understands monitoring blood glucose, interpreting and using results Needs Instruction   Patient understands prevention, detection, and treatment of acute complications. Needs Instruction   Patient understands  prevention, detection, and treatment of chronic complications. Needs Instruction   Patient understands how to develop strategies to address psychosocial issues. Needs Instruction   Patient understands how to develop strategies to promote health/change behavior. Needs Instruction     Complications   Last HgB A1C per patient/outside source 7.1 %   How often do you check your blood sugar? 1-2 times/day   Fasting Blood glucose range (mg/dL) >200     Dietary Intake   Breakfast Grits 2 packs and water   Lunch skips   Snack (afternoon) 2 cocount pie mni pies   Dinner 2 chicken sandwiches with bun wtih kup,  Sweet tea   Beverage(s) sweet tea     Exercise   Exercise Type ADL's     Patient Education   Previous Diabetes Education No      Individualized Plan for Diabetes Self-Management Training:   Learning Objective:  Patient will have a greater understanding of diabetes self-management. Patient education plan is to attend individual and/or group sessions per assessed needs and concerns.   Plan:   Patient Instructions  Goals 1. Follow My Plate 2. Cut out sweet tea and sodas 3. Eat three meals day 4. Avoid skip meals 5. Increase fresh fruits and vegetables Lose 1 lb per week      Expected Outcomes:     Education material provided: Living Well with Diabetes, Food label handouts, A1C conversion sheet, Meal plan card, My Plate and Carbohydrate counting sheet  If problems or questions, patient to contact team via:  Phone and Email  Future DSME appointment:   1 month

## 2017-05-26 ENCOUNTER — Ambulatory Visit (HOSPITAL_COMMUNITY)
Admission: RE | Admit: 2017-05-26 | Discharge: 2017-05-26 | Disposition: A | Discharge status: Home / Self Care | Payer: Medicare Other | Source: Ambulatory Visit | Attending: Cardiovascular Disease | Admitting: Cardiovascular Disease

## 2017-05-26 ENCOUNTER — Inpatient Hospital Stay (HOSPITAL_COMMUNITY): Admission: RE | Admit: 2017-05-26 | Payer: Medicare Other | Source: Ambulatory Visit

## 2017-05-26 ENCOUNTER — Encounter (HOSPITAL_COMMUNITY)
Admission: RE | Admit: 2017-05-26 | Discharge: 2017-05-26 | Disposition: A | Discharge status: Home / Self Care | Payer: Medicare Other | Source: Ambulatory Visit | Attending: Cardiovascular Disease | Admitting: Cardiovascular Disease

## 2017-05-26 ENCOUNTER — Encounter (HOSPITAL_COMMUNITY): Payer: Self-pay

## 2017-05-26 DIAGNOSIS — R079 Chest pain, unspecified: Secondary | ICD-10-CM | POA: Diagnosis not present

## 2017-05-26 DIAGNOSIS — R55 Syncope and collapse: Secondary | ICD-10-CM | POA: Insufficient documentation

## 2017-05-26 LAB — NM MYOCAR MULTI W/SPECT W/WALL MOTION / EF
CHL CUP NUCLEAR SDS: 3
CHL CUP NUCLEAR SSS: 3
LHR: 0.44
LVDIAVOL: 107 mL (ref 62–150)
LVSYSVOL: 36 mL
NUC STRESS TID: 1.09
Peak HR: 100 {beats}/min
Rest HR: 62 {beats}/min
SRS: 0

## 2017-05-26 MED ORDER — PERFLUTREN LIPID MICROSPHERE
1.0000 mL | INTRAVENOUS | Status: AC | PRN
  Administered 2017-05-26: 1 mL via INTRAVENOUS
  Filled 2017-05-26: qty 10

## 2017-05-26 MED ORDER — TECHNETIUM TC 99M TETROFOSMIN IV KIT
30.0000 | PACK | Freq: Once | INTRAVENOUS | Status: AC | PRN
  Administered 2017-05-26: 31.6 via INTRAVENOUS

## 2017-05-26 MED ORDER — REGADENOSON 0.4 MG/5ML IV SOLN
INTRAVENOUS | Status: AC
  Administered 2017-05-26: 0.4 mg
  Filled 2017-05-26: qty 5

## 2017-05-26 MED ORDER — SODIUM CHLORIDE 0.9% FLUSH
INTRAVENOUS | Status: AC
  Filled 2017-05-26: qty 180

## 2017-05-26 MED ORDER — SODIUM CHLORIDE 0.9% FLUSH
INTRAVENOUS | Status: AC
  Filled 2017-05-26: qty 10

## 2017-05-26 MED ORDER — TECHNETIUM TC 99M TETROFOSMIN IV KIT
10.0000 | PACK | Freq: Once | INTRAVENOUS | Status: AC | PRN
  Administered 2017-05-26: 10.6 via INTRAVENOUS

## 2017-05-26 NOTE — Progress Notes (Signed)
*  PRELIMINARY RESULTS* Echocardiogram 2D Echocardiogram with definity has been performed.  Leavy Cella 05/26/2017, 1:22 PM

## 2017-05-27 ENCOUNTER — Telehealth: Payer: Self-pay | Admitting: *Deleted

## 2017-05-27 NOTE — Telephone Encounter (Signed)
Notes recorded by Laurine Blazer, LPN on 02/12/5912 at 68:59 PM EDT Patient notified. No pmd. Follow up already scheduled for 07/29/2017 with Dr. Bronson Ing. ------

## 2017-05-27 NOTE — Telephone Encounter (Signed)
STRESS TEST -  Notes recorded by Herminio Commons, MD on 05/26/2017 at 3:41 PM EDT Normal.   ECHO -  Notes recorded by Herminio Commons, MD on 05/26/2017 at 3:27 PM EDT Normal pumping function.

## 2017-05-28 ENCOUNTER — Ambulatory Visit: Payer: Medicare Other | Attending: Cardiovascular Disease | Admitting: Neurology

## 2017-05-28 DIAGNOSIS — G4733 Obstructive sleep apnea (adult) (pediatric): Secondary | ICD-10-CM | POA: Diagnosis not present

## 2017-05-28 DIAGNOSIS — G4730 Sleep apnea, unspecified: Secondary | ICD-10-CM

## 2017-06-05 NOTE — Procedures (Signed)
[place] A. Merlene Laughter, MD     [email or web address]             NOCTURNAL POLYSOMNOGRAPHY   LOCATION: [facility]   Patient Name: [name], [name] Date: 05/28/2017 Gender: Male D.O.B: 23-Oct-1976 Age (years): 40 Referring Provider: Herminio Commons Height (inches): 68 Interpreting Physician: Phillips Odor MD, ABSM Weight (lbs): 256 RPSGT: Peak, Robert BMI: 38 MRN: [id number] Neck Size: 20.00 CLINICAL INFORMATION Sleep Study Type: NPSG  Indication for sleep study: COPD, Snoring  Epworth Sleepiness Score: 7  SLEEP STUDY TECHNIQUE As per the AASM Manual for the Scoring of Sleep and Associated Events v2.3 (April 2016) with a hypopnea requiring 4% desaturations.  The channels recorded and monitored were frontal, central and occipital EEG, electrooculogram (EOG), submentalis EMG (chin), nasal and oral airflow, thoracic and abdominal wall motion, anterior tibialis EMG, snore microphone, electrocardiogram, and pulse oximetry.  MEDICATIONS Medications self-administered by patient taken the night of the study : N/A  Current Outpatient Prescriptions:  .  amLODipine (NORVASC) 10 MG tablet, Take 10 mg by mouth daily., Disp: , Rfl:  .  baclofen (LIORESAL) 10 MG tablet, Take 10 mg by mouth 3 (three) times daily., Disp: , Rfl:  .  benazepril-hydrochlorthiazide (LOTENSIN HCT) 10-12.5 MG tablet, Take 1 tablet by mouth daily., Disp: , Rfl:  .  gabapentin (NEURONTIN) 600 MG tablet, Take 600 mg by mouth 4 (four) times daily., Disp: , Rfl:  .  ibuprofen (ADVIL,MOTRIN) 200 MG tablet, Take 800 mg by mouth every 6 (six) hours as needed for moderate pain., Disp: , Rfl:  .  metFORMIN (GLUCOPHAGE) 500 MG tablet, Take 500 mg by mouth 2 (two) times daily with a meal., Disp: , Rfl:  .  omeprazole (PRILOSEC) 20 MG capsule, Take 20 mg by mouth daily., Disp: , Rfl:  .  oxymorphone (OPANA) 5 MG tablet, Take 5 mg by mouth 4 (four) times daily. , Disp: , Rfl: 0 .  potassium  chloride SA (K-DUR,KLOR-CON) 20 MEQ tablet, Take 1 tablet (20 mEq total) by mouth once., Disp: 3 tablet, Rfl: 0 .  SPIRIVA HANDIHALER 18 MCG inhalation capsule, Place 18 mcg into inhaler and inhale daily., Disp: , Rfl:     SLEEP ARCHITECTURE The study was initiated at 9:36:57 PM and ended at 4:33:32 AM.  Sleep onset time was 2.7 minutes and the sleep efficiency was 69.9%. The total sleep time was 291.4 minutes.  Stage REM latency was 127.0 minutes.  The patient spent 27.24% of the night in stage N1 sleep, 56.80% in stage N2 sleep, 0.00% in stage N3 and 15.96% in REM.  Alpha intrusion was absent.  Supine sleep was 71.17%.  RESPIRATORY PARAMETERS The overall apnea/hypopnea index (AHI) was 46.5 per hour. There were 31 total apneas, including 24 obstructive, 7 central and 0 mixed apneas. There were 195 hypopneas and 58 RERAs.  The AHI during Stage REM sleep was 55.5 per hour.  AHI while supine was 53.5 per hour.  The mean oxygen saturation was 92.19%. The minimum SpO2 during sleep was 83.00%.  Moderate snoring was noted during this study.  CARDIAC DATA The 2 lead EKG demonstrated sinus rhythm. The mean heart rate was 71.31 beats per minute. Other EKG findings include: None. LEG MOVEMENT DATA The total PLMS were 0 with a resulting PLMS index of 0.00. Associated arousal with leg movement index was 0.0.  IMPRESSIONS - Severe obstructive sleep apnea worse during REM sleep is documented during this recording. AutoPap 9-14 is recommended. - Absent slow wave  sleep is observed.  Delano Metz, MD Diplomate, American Board of Sleep Medicine.  ELECTRONICALLY SIGNED ON:  06/05/2017, 4:31 PM [place] PH: [phone]   FX: [phone] [place]

## 2017-06-24 ENCOUNTER — Ambulatory Visit: Payer: Medicare Other | Admitting: Nutrition

## 2017-07-03 DIAGNOSIS — Z0000 Encounter for general adult medical examination without abnormal findings: Secondary | ICD-10-CM | POA: Diagnosis not present

## 2017-07-03 DIAGNOSIS — E119 Type 2 diabetes mellitus without complications: Secondary | ICD-10-CM | POA: Diagnosis not present

## 2017-07-03 DIAGNOSIS — Z1211 Encounter for screening for malignant neoplasm of colon: Secondary | ICD-10-CM | POA: Diagnosis not present

## 2017-07-03 DIAGNOSIS — I10 Essential (primary) hypertension: Secondary | ICD-10-CM | POA: Diagnosis not present

## 2017-07-16 DIAGNOSIS — H9191 Unspecified hearing loss, right ear: Secondary | ICD-10-CM | POA: Diagnosis not present

## 2017-07-16 DIAGNOSIS — Z8371 Family history of colonic polyps: Secondary | ICD-10-CM | POA: Diagnosis not present

## 2017-07-16 DIAGNOSIS — L089 Local infection of the skin and subcutaneous tissue, unspecified: Secondary | ICD-10-CM | POA: Diagnosis not present

## 2017-07-16 DIAGNOSIS — I10 Essential (primary) hypertension: Secondary | ICD-10-CM | POA: Diagnosis not present

## 2017-07-16 DIAGNOSIS — K625 Hemorrhage of anus and rectum: Secondary | ICD-10-CM | POA: Diagnosis not present

## 2017-07-21 DIAGNOSIS — M545 Low back pain: Secondary | ICD-10-CM | POA: Diagnosis not present

## 2017-07-21 DIAGNOSIS — I10 Essential (primary) hypertension: Secondary | ICD-10-CM | POA: Diagnosis not present

## 2017-07-21 DIAGNOSIS — M542 Cervicalgia: Secondary | ICD-10-CM | POA: Diagnosis not present

## 2017-07-29 ENCOUNTER — Encounter: Payer: Self-pay | Admitting: Cardiovascular Disease

## 2017-07-29 ENCOUNTER — Ambulatory Visit (INDEPENDENT_AMBULATORY_CARE_PROVIDER_SITE_OTHER): Payer: Medicare Other | Admitting: Cardiovascular Disease

## 2017-07-29 VITALS — BP 116/80 | HR 73 | Ht 69.0 in | Wt 246.2 lb

## 2017-07-29 DIAGNOSIS — G4730 Sleep apnea, unspecified: Secondary | ICD-10-CM

## 2017-07-29 DIAGNOSIS — R55 Syncope and collapse: Secondary | ICD-10-CM | POA: Diagnosis not present

## 2017-07-29 DIAGNOSIS — Z8279 Family history of other congenital malformations, deformations and chromosomal abnormalities: Secondary | ICD-10-CM

## 2017-07-29 DIAGNOSIS — I10 Essential (primary) hypertension: Secondary | ICD-10-CM

## 2017-07-29 DIAGNOSIS — R079 Chest pain, unspecified: Secondary | ICD-10-CM | POA: Diagnosis not present

## 2017-07-29 DIAGNOSIS — R002 Palpitations: Secondary | ICD-10-CM | POA: Diagnosis not present

## 2017-07-29 NOTE — Addendum Note (Signed)
Addended by: Laurine Blazer on: 07/29/2017 04:23 PM   Modules accepted: Orders

## 2017-07-29 NOTE — Progress Notes (Signed)
SUBJECTIVE: The patient returns for follow-up after undergoing cardiovascular testing performed for the evaluation of chest pain and near syncope.  Nuclear stress test was normal on 05/26/17.  Echocardiogram demonstrated a poorly visualized trileaflet aortic valve. There was normal left ventricular systolic function and regional wall motion, LVEF 60-65%.  He reportedly has a history of anxiety, depression, PTSD, chronic back pain, hypertension, and mild COPD.  He now only rarely gets chest pains when he is stressed. He has had no further episodes of near syncope.  Social history: His father is also my patient.  Review of Systems: As per "subjective", otherwise negative.  Allergies  Allergen Reactions  . Clindamycin/Lincomycin Nausea Only    "feel funny in head"  . Penicillins      "made my gums bleed as a child" Has patient had a PCN reaction causing immediate rash, facial/tongue/throat swelling, SOB or lightheadedness with hypotension: no Has patient had a PCN reaction causing severe rash involving mucus membranes or skin necrosis: No Has patient had a PCN reaction that required hospitalization No Has patient had a PCN reaction occurring within the last 10 years: No If all of the above answers are "NO", then may proceed with Cephalosporin use.     Current Outpatient Prescriptions  Medication Sig Dispense Refill  . amLODipine (NORVASC) 10 MG tablet Take 10 mg by mouth daily.    . baclofen (LIORESAL) 10 MG tablet Take 10 mg by mouth 3 (three) times daily.    . benazepril-hydrochlorthiazide (LOTENSIN HCT) 10-12.5 MG tablet Take 1 tablet by mouth daily.    Marland Kitchen gabapentin (NEURONTIN) 600 MG tablet Take 600 mg by mouth 4 (four) times daily.    Marland Kitchen ibuprofen (ADVIL,MOTRIN) 200 MG tablet Take 800 mg by mouth every 6 (six) hours as needed for moderate pain.    . metFORMIN (GLUCOPHAGE-XR) 500 MG 24 hr tablet Take 500 mg by mouth daily with breakfast.    . omeprazole (PRILOSEC) 20 MG  capsule Take 20 mg by mouth daily.    Marland Kitchen oxymorphone (OPANA) 5 MG tablet Take 5 mg by mouth 4 (four) times daily.   0  . SPIRIVA HANDIHALER 18 MCG inhalation capsule Place 18 mcg into inhaler and inhale daily.     No current facility-administered medications for this visit.     Past Medical History:  Diagnosis Date  . Chronic pain   . COPD (chronic obstructive pulmonary disease) ([place])   . Current every day smoker   . High blood pressure   . Hypoglycemia   . Opioid dependence ([place])     Past Surgical History:  Procedure Laterality Date  . BACK SURGERY    . EYE SURGERY      Social History   Social History  . Marital status: Legally Separated    Spouse name: N/A  . Number of children: N/A  . Years of education: N/A   Occupational History  . retired./disabled    Social History Main Topics  . Smoking status: Current Every Day Smoker    Packs/day: 2.00    Years: 21.00    Types: Cigarettes  . Smokeless tobacco: Never Used  . Alcohol use No  . Drug use: No  . Sexual activity: Yes   Other Topics Concern  . Not on file   Social History Narrative  . No narrative on file     Vitals:   07/29/17 1016  BP: 116/80  Pulse: 73  SpO2: 96%  Weight: 246 lb 3.2 oz (  111.7 kg)  Height: 5\' 9"  (1.753 m)    Wt Readings from Last 3 Encounters:  07/29/17 246 lb 3.2 oz (111.7 kg)  04/27/17 259 lb (117.5 kg)  04/15/17 256 lb (116.1 kg)     PHYSICAL EXAM General: NAD HEENT: Normal. Neck: No JVD, no thyromegaly. Lungs: Clear to auscultation bilaterally with normal respiratory effort. CV: Nondisplaced PMI.  Regular rate and rhythm, normal S1/S2, no S3/S4, no murmur. No pretibial or periankle edema.  No carotid bruit.   Abdomen: Soft, nontender, no distention.  Neurologic: Alert and oriented.  Psych: Normal affect. Skin: Normal. Musculoskeletal: No gross deformities.    ECG: Most recent ECG reviewed.   Labs: Lab Results  Component Value Date/Time   K 3.3 (L)  08/11/2016 03:48 AM   BUN 8 08/11/2016 03:48 AM   CREATININE 0.68 08/11/2016 03:48 AM   ALT 22 08/10/2016 04:56 AM   HGB 12.7 (L) 08/11/2016 03:48 AM     Lipids: No results found for: LDLCALC, LDLDIRECT, CHOL, TRIG, HDL     ASSESSMENT AND PLAN:  1. Chest pain and near syncope: Normal nuclear stress test and reassuring echocardiogram. No further testing is indicated.  2. History of bicuspid aortic valve in mother: Trileaflet aortic valve seen by echocardiography.  3. Hypertension: Controlled. No changes to therapy.  4. Sleep apnea: I will make a referral to a sleep specialist.   Disposition: Follow up prn.   Kate Sable, M.D., F.A.C.C.

## 2017-07-29 NOTE — Patient Instructions (Signed)
Medication Instructions:  Continue all current medications.  Labwork: none  Testing/Procedures: none  Follow-Up: As needed  Any Other Special Instructions Will Be Listed Below (If Applicable). You have been referred to:  Sleep medicine - will call.    If you need a refill on your cardiac medications before your next appointment, please call your pharmacy.

## 2017-08-08 ENCOUNTER — Emergency Department (HOSPITAL_COMMUNITY)
Admission: EM | Admit: 2017-08-08 | Discharge: 2017-08-08 | Disposition: A | Discharge status: Home / Self Care | Payer: Medicare Other | Attending: Emergency Medicine | Admitting: Emergency Medicine

## 2017-08-08 ENCOUNTER — Encounter (HOSPITAL_COMMUNITY): Payer: Self-pay

## 2017-08-08 DIAGNOSIS — Y929 Unspecified place or not applicable: Secondary | ICD-10-CM | POA: Diagnosis not present

## 2017-08-08 DIAGNOSIS — Y939 Activity, unspecified: Secondary | ICD-10-CM | POA: Diagnosis not present

## 2017-08-08 DIAGNOSIS — Y998 Other external cause status: Secondary | ICD-10-CM | POA: Insufficient documentation

## 2017-08-08 DIAGNOSIS — W57XXXA Bitten or stung by nonvenomous insect and other nonvenomous arthropods, initial encounter: Secondary | ICD-10-CM | POA: Diagnosis not present

## 2017-08-08 DIAGNOSIS — S20469A Insect bite (nonvenomous) of unspecified back wall of thorax, initial encounter: Secondary | ICD-10-CM | POA: Diagnosis not present

## 2017-08-08 NOTE — ED Triage Notes (Signed)
Pt states he was stung by a japanese hornet approx 1 hour ago to his upper back.  Pt denies complaints other than pain.

## 2017-08-08 NOTE — ED Notes (Signed)
Pt states he was stung in the back about an hour ago. Says some pain in arm & top of head. Redness noticed to the back.

## 2017-08-08 NOTE — ED Notes (Signed)
Pt says something came up w/ his grandson & he thought he was going to be ok. Pt left without being seen,

## 2017-09-21 ENCOUNTER — Telehealth: Payer: Self-pay | Admitting: Cardiovascular Disease

## 2017-09-21 DIAGNOSIS — R0681 Apnea, not elsewhere classified: Secondary | ICD-10-CM

## 2017-09-21 NOTE — Telephone Encounter (Signed)
Results are in Epic but do not see where they have been read. Will forward to provider

## 2017-09-21 NOTE — Telephone Encounter (Signed)
Patient called asking what he is suppose to be doing with results of his sleep study.  Not sure if he is suppose to get sleep apnea machine or not

## 2017-09-21 NOTE — Telephone Encounter (Signed)
LM to return call - referral place and forwarded to schedulers

## 2017-09-21 NOTE — Telephone Encounter (Signed)
Contact sleep center to get final official interpretation by MD. Grossly, he is having apneic episodes. Please refer him to sleep specialist in [place].

## 2017-09-21 NOTE — Telephone Encounter (Signed)
Pt made aware and will et [place] know if he doesn't get a call about appt with specialist by Friday

## 2017-09-24 ENCOUNTER — Other Ambulatory Visit: Payer: Self-pay | Admitting: *Deleted

## 2017-09-24 DIAGNOSIS — R0681 Apnea, not elsewhere classified: Secondary | ICD-10-CM

## 2017-10-15 DIAGNOSIS — M545 Low back pain: Secondary | ICD-10-CM | POA: Diagnosis not present

## 2017-10-15 DIAGNOSIS — M542 Cervicalgia: Secondary | ICD-10-CM | POA: Diagnosis not present

## 2017-10-15 DIAGNOSIS — I10 Essential (primary) hypertension: Secondary | ICD-10-CM | POA: Diagnosis not present

## 2017-11-12 DIAGNOSIS — J449 Chronic obstructive pulmonary disease, unspecified: Secondary | ICD-10-CM | POA: Diagnosis not present

## 2017-11-12 DIAGNOSIS — I10 Essential (primary) hypertension: Secondary | ICD-10-CM | POA: Diagnosis not present

## 2017-11-12 DIAGNOSIS — K210 Gastro-esophageal reflux disease with esophagitis: Secondary | ICD-10-CM | POA: Diagnosis not present

## 2017-11-12 DIAGNOSIS — G4733 Obstructive sleep apnea (adult) (pediatric): Secondary | ICD-10-CM | POA: Diagnosis not present

## 2017-12-02 DIAGNOSIS — I10 Essential (primary) hypertension: Secondary | ICD-10-CM | POA: Diagnosis not present

## 2017-12-02 DIAGNOSIS — M5412 Radiculopathy, cervical region: Secondary | ICD-10-CM | POA: Diagnosis not present

## 2017-12-02 DIAGNOSIS — M542 Cervicalgia: Secondary | ICD-10-CM | POA: Diagnosis not present

## 2017-12-03 DIAGNOSIS — R200 Anesthesia of skin: Secondary | ICD-10-CM | POA: Diagnosis not present

## 2017-12-03 DIAGNOSIS — I10 Essential (primary) hypertension: Secondary | ICD-10-CM | POA: Diagnosis not present

## 2017-12-03 DIAGNOSIS — M79641 Pain in right hand: Secondary | ICD-10-CM | POA: Diagnosis not present

## 2017-12-03 DIAGNOSIS — M79642 Pain in left hand: Secondary | ICD-10-CM | POA: Diagnosis not present

## 2017-12-15 ENCOUNTER — Telehealth: Payer: Self-pay | Admitting: Cardiovascular Disease

## 2017-12-15 NOTE — Telephone Encounter (Signed)
Faxed LOV as requested LM on pt VM

## 2017-12-15 NOTE — Telephone Encounter (Signed)
Pt is needing to speak w/ someone about the sleep study Dr. Bronson Ing ordered for him, he's having trouble getting his ins to pay for his C-pap. Can be reached @ [phone]

## 2017-12-15 NOTE — Telephone Encounter (Signed)
Mr. Nez called stating that we need to fax over recent office notes to his insurance company so that he can be approved . [phone] (fax) Attention: Dimas Chyle.  [phone] Office #

## 2017-12-17 DIAGNOSIS — J0100 Acute maxillary sinusitis, unspecified: Secondary | ICD-10-CM | POA: Diagnosis not present

## 2017-12-17 DIAGNOSIS — I10 Essential (primary) hypertension: Secondary | ICD-10-CM | POA: Diagnosis not present

## 2017-12-22 DIAGNOSIS — M79641 Pain in right hand: Secondary | ICD-10-CM | POA: Diagnosis not present

## 2017-12-22 DIAGNOSIS — M79642 Pain in left hand: Secondary | ICD-10-CM | POA: Diagnosis not present

## 2017-12-22 DIAGNOSIS — G5621 Lesion of ulnar nerve, right upper limb: Secondary | ICD-10-CM | POA: Diagnosis not present

## 2017-12-29 DIAGNOSIS — I10 Essential (primary) hypertension: Secondary | ICD-10-CM | POA: Diagnosis not present

## 2018-01-04 DIAGNOSIS — G5621 Lesion of ulnar nerve, right upper limb: Secondary | ICD-10-CM | POA: Diagnosis not present

## 2018-01-04 DIAGNOSIS — M180 Bilateral primary osteoarthritis of first carpometacarpal joints: Secondary | ICD-10-CM | POA: Diagnosis not present

## 2018-01-11 DIAGNOSIS — M542 Cervicalgia: Secondary | ICD-10-CM | POA: Diagnosis not present

## 2018-01-11 DIAGNOSIS — M545 Low back pain: Secondary | ICD-10-CM | POA: Diagnosis not present

## 2018-01-11 DIAGNOSIS — I10 Essential (primary) hypertension: Secondary | ICD-10-CM | POA: Diagnosis not present

## 2018-04-01 DIAGNOSIS — M542 Cervicalgia: Secondary | ICD-10-CM | POA: Diagnosis not present

## 2018-04-01 DIAGNOSIS — M545 Low back pain: Secondary | ICD-10-CM | POA: Diagnosis not present

## 2018-04-13 DIAGNOSIS — M79641 Pain in right hand: Secondary | ICD-10-CM | POA: Diagnosis not present

## 2018-04-13 DIAGNOSIS — M79642 Pain in left hand: Secondary | ICD-10-CM | POA: Diagnosis not present

## 2018-04-13 DIAGNOSIS — G5621 Lesion of ulnar nerve, right upper limb: Secondary | ICD-10-CM | POA: Diagnosis not present

## 2018-05-31 DIAGNOSIS — K219 Gastro-esophageal reflux disease without esophagitis: Secondary | ICD-10-CM | POA: Diagnosis not present

## 2018-05-31 DIAGNOSIS — R079 Chest pain, unspecified: Secondary | ICD-10-CM | POA: Diagnosis not present

## 2018-06-10 DIAGNOSIS — R4020 Unspecified coma: Secondary | ICD-10-CM | POA: Diagnosis not present

## 2018-06-10 DIAGNOSIS — R55 Syncope and collapse: Secondary | ICD-10-CM | POA: Diagnosis not present

## 2018-06-17 ENCOUNTER — Ambulatory Visit: Payer: Medicare Other | Admitting: Physician Assistant

## 2018-06-17 ENCOUNTER — Encounter: Payer: Self-pay | Admitting: *Deleted

## 2018-06-21 NOTE — Progress Notes (Signed)
Cardiology Office Note    Date:  06/22/2018   ID:  KDYN VONBEHREN, DOB 1976/03/16, MRN [id number]  PCP:  Jettie Booze, NP  Cardiologist: Kate Sable, MD    Chief Complaint  Patient presents with  . Follow-up    recent syncopal event    History of Present Illness:    [name] is a 42 y.o. male with past medical history of HTN, Type 2 DM, OSA, and tobacco use who presents to the office today for evaluation of a syncopal event.   He was last examined by Dr. Bronson Ing in 07/2017 and reported overall doing well. Did report episodes of chest pain when feeling emotionally stressed but denied any exertional symptoms. He had undergone a workup previously for presyncope and echocardiogram showed no structural abnormalities. Also had a normal NST in 05/2017. No further testing was pursued at that time.   In talking with the patient today, he reports having a syncopal event [place] approximately 3 weeks ago. Reports he had been sitting at a slot machine and all of a sudden developed blurred vision and diaphoresis. He attempted to get up and walk around but symptoms worsened and he woke up on the floor. He did not seek emergency care at that time. Unsure how long he lost consciousness for. Says they did check his blood sugar and this was within her normal range. Denies any alcohol consumption or drug use. He denies any associated chest pain or palpitations at that time. He followed up with his PCP upon returning home and labs were obtained at that time. These are available for review by Care Everywhere which show WBC 9.8, Hgb 16.0, Na+ 142, K+ 4.0, creatinine 0.80. Troponin value was negative. 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 This is overall similar to his prior tracing in 07/2016.  He denies any repeat syncopal or presyncopal episodes since upon returning home. Says that he does have intermittent  episodes of chest discomfort which were occurring last year and led to him undergoing stress testing which showed no acute findings. No change in his symptoms or the frequency of the episodes. His episodes of chest pain can last for 5 hours up to days at a time and is not associated with exertion or positional changes. Denies any recent orthopnea, PND, or lower extremity edema.  He was previously smoking 5 packs per day but says he has reduced his tobacco use to less than a pack per day while being on Chantix.   Past Medical History:  Diagnosis Date  . Chronic pain   . COPD (chronic obstructive pulmonary disease) ([place])   . Current every day smoker   . High blood pressure   . Hypoglycemia   . Opioid dependence ([place])     Past Surgical History:  Procedure Laterality Date  . BACK SURGERY    . EYE SURGERY      Current Medications: Outpatient Medications Prior to Visit  Medication Sig Dispense Refill  . amLODipine (NORVASC) 10 MG tablet Take 10 mg by mouth daily.    . baclofen (LIORESAL) 10 MG tablet Take 10 mg by mouth 3 (three) times daily.    . benazepril-hydrochlorthiazide (LOTENSIN HCT) 10-12.5 MG tablet Take 1 tablet by mouth daily.    . CHANTIX STARTING MONTH PAK 0.5 MG X 11 & 1 MG X 42 tablet Take 1 tablet by mouth as directed.    Marland Kitchen  gabapentin (NEURONTIN) 600 MG tablet Take 600 mg by mouth 4 (four) times daily.    Marland Kitchen ibuprofen (ADVIL,MOTRIN) 200 MG tablet Take 800 mg by mouth every 6 (six) hours as needed for moderate pain.    Marland Kitchen LORazepam (ATIVAN) 0.5 MG tablet Take 1 tablet by mouth daily as needed.    . metFORMIN (GLUCOPHAGE-XR) 500 MG 24 hr tablet Take 500 mg by mouth daily with breakfast.    . omeprazole (PRILOSEC) 20 MG capsule Take 20 mg by mouth daily.    Marland Kitchen oxymorphone (OPANA) 5 MG tablet Take 5 mg by mouth 4 (four) times daily.   0  . SPIRIVA HANDIHALER 18 MCG inhalation capsule Place 18 mcg into inhaler and inhale daily.     No facility-administered medications prior to  visit.      Allergies:   Clindamycin/lincomycin   Social History   Socioeconomic History  . Marital status: Legally Separated    Spouse name: Not on file  . Number of children: Not on file  . Years of education: Not on file  . Highest education level: Not on file  Occupational History  . Occupation: retired./disabled  Social Needs  . Financial resource strain: Not on file  . Food insecurity:    Worry: Not on file    Inability: Not on file  . Transportation needs:    Medical: Not on file    Non-medical: Not on file  Tobacco Use  . Smoking status: Current Every Day Smoker    Packs/day: 2.00    Years: 21.00    Pack years: 42.00    Types: Cigarettes  . Smokeless tobacco: Never Used  Substance and Sexual Activity  . Alcohol use: No  . Drug use: No  . Sexual activity: Yes  Lifestyle  . Physical activity:    Days per week: Not on file    Minutes per session: Not on file  . Stress: Not on file  Relationships  . Social connections:    Talks on phone: Not on file    Gets together: Not on file    Attends religious service: Not on file    Active member of club or organization: Not on file    Attends meetings of clubs or organizations: Not on file    Relationship status: Not on file  Other Topics Concern  . Not on file  Social History Narrative  . Not on file     Family History:  The patient's family history includes Asthma in his sister; Diabetes in his father; Heart Problems in his mother; Heart attack in his father.   Review of Systems:   Please see the history of present illness.     General:  No chills, fever, night sweats or weight changes.  Positive for syncopal episode. Cardiovascular:  No dyspnea on exertion, edema, orthopnea, palpitations, paroxysmal nocturnal dyspnea.  Positive for chest pain. Dermatological: No rash, lesions/masses Respiratory: No cough, dyspnea Urologic: No hematuria, dysuria Abdominal:   No nausea, vomiting, diarrhea, bright red blood  per rectum, melena, or hematemesis Neurologic:  No visual changes, wkns, changes in mental status. All other systems reviewed and are otherwise negative except as noted above.   Physical Exam:    VS:  BP 134/90   Pulse 88   Ht 5\' 9"  (1.753 m)   Wt 254 lb 9.6 oz (115.5 kg)   SpO2 95% Comment: on room air  BMI 37.60 kg/m    General: Well developed, obese Caucasian male appearing in no  acute distress. Head: Normocephalic, atraumatic, sclera non-icteric, no xanthomas, nares are without discharge.  Neck: No carotid bruits. JVD not elevated.  Lungs: Respirations regular and unlabored, without wheezes or rales.  Heart: Regular rate and rhythm. No S3 or S4.  No murmur, no rubs, or gallops appreciated. Abdomen: Soft, non-tender, non-distended with normoactive bowel sounds. No hepatomegaly. No rebound/guarding. No obvious abdominal masses. Msk:  Strength and tone appear normal for age. No joint deformities or effusions. Extremities: No clubbing or cyanosis. No lower extremity edema.  Distal pedal pulses are 2+ bilaterally. Neuro: Alert and oriented X 3. Moves all extremities spontaneously. No focal deficits noted. Psych:  Responds to questions appropriately with a normal affect. Skin: No rashes or lesions noted  Wt Readings from Last 3 Encounters:  06/22/18 254 lb 9.6 oz (115.5 kg)  08/08/17 242 lb (109.8 kg)  07/29/17 246 lb 3.2 oz (111.7 kg)     Studies/Labs Reviewed:   EKG:  EKG is not ordered today.    Recent Labs: No results found for requested labs within last 8760 hours.   Lipid Panel No results found for: CHOL, TRIG, HDL, CHOLHDL, VLDL, LDLCALC, LDLDIRECT  Additional studies/ records that were reviewed today include:   Echocardiogram: 05/26/2017 Study Conclusions  - Procedure narrative: Transthoracic echocardiography. Image   quality was adequate. Intravenous contrast (Definity) was   administered. - Left ventricle: The cavity size was normal. Wall thickness was    increased in a pattern of mild LVH. Systolic function was normal.   The estimated ejection fraction was in the range of 60% to 65%.   Wall motion was normal; there were no regional wall motion   abnormalities. Indeterminate diastolic function.  NST: 05/26/2017  Diffuse nonspecific T wave abnormalities seen throughout study.  The study is normal. No myocardial ischemia or scar.  This is a low risk study.  Nuclear stress EF: 66%.     Assessment:    1. Syncope, unspecified syncope type   2. Essential hypertension   3. Atypical chest pain   4. Sleep apnea, unspecified type   5. Snoring   6. Daytime somnolence   7. Tobacco use      Plan:   In order of problems listed above:  1. Syncope - He reports having a syncopal event while [place] a [place] 3 weeks ago. Reports associated blurred vision and diaphoresis at that time but denies any specific palpitations or chest discomfort. No seizure-like activity was witnessed by bystanders per his report. He denies any repeat episodes of presyncope or syncope since. Labs were recently obtained by his PCP by review of Care Everywhere and showed no acute abnormalities.  - Recent echocardiogram showed normal pumping function of the heart with no regional wall motion abnormalities. Will plan to obtain a 14-day cardiac monitor to rule out any significant arrhythmias as he does report intermittent palpitations at times.   2. HTN - BP is slightly elevated at 134/90 during today's visit. He reports this has overall been well controlled. I have asked him to continue to follow this in the ambulatory setting. Will continue on Amlodipine 10 mg daily and  Benazepril-HCTZ 10-12.5mg  daily.   3. Atypical Chest Pain - He reports intermittent episodes of chest discomfort over the past several years which can last for 5 hours up to several days at a time and is not associated with exertion or positional changes. This is similar to what he was experiencing last year  and NST at that time showed no evidence  of ischemia or scar, overall being a low risk study. Recent EKG obtained by his PCP shows known RBBB and LAFB. Overall, his symptoms seem atypical for a cardiac etiology. Would not pursue repeat ischemic evaluation at this time given his recent NST in 05/2017  4. Sleep Apnea/ Snoring/ Daytime Somnolence - He underwent a sleep study in 05/2017 which showed severe OSA and he has tried to obtain a CPAP machine in the interim but by his report his insurance is requiring another sleep study due to the timeframe between his last test. Will plan for a repeat sleep study (in [place] at the patient's request).  5. Tobacco Use - he was previously smoking 5 packs per day (not a typo) but says he has reduced his tobacco use to less than a pack per day while being on Chantix. Congratulated on his reduction with complete cessation advised.    Medication Adjustments/Labs and Tests Ordered: Current medicines are reviewed at length with the patient today.  Concerns regarding medicines are outlined above.  Medication changes, Labs and Tests ordered today are listed in the Patient Instructions below. Patient Instructions  Medication Instructions:   Your physician recommends that you continue on your current medications as directed. Please refer to the Current Medication list given to you today.  Labwork:  NONE  Testing/Procedures: Your physician has recommended that you wear an event monitor for 14 days. Event monitors are medical devices that record the heart's electrical activity. Doctors most often [place] these monitors to diagnose arrhythmias. Arrhythmias are problems with the speed or rhythm of the heartbeat. The monitor is a small, portable device. You can wear one while you do your normal daily activities. This is usually used to diagnose what is causing palpitations/syncope (passing out). Your physician has recommended that you have a sleep study. This test records  several body functions during sleep, including: brain activity, eye movement, oxygen and carbon dioxide blood levels, heart rate and rhythm, breathing rate and rhythm, the flow of air through your mouth and nose, snoring, body muscle movements, and chest and belly movement.  Follow-Up:  Your physician recommends that you schedule a follow-up appointment in: 4 months with Dr. Bronson Ing.  Any Other Special Instructions Will Be Listed Below (If Applicable).  If you need a refill on your cardiac medications before your next appointment, please call your pharmacy.    Signed, Erma Heritage, PA-C  06/22/2018 4:48 PM    [facility] [address] [place], [place] [address] Phone: [phone]

## 2018-06-22 ENCOUNTER — Ambulatory Visit (INDEPENDENT_AMBULATORY_CARE_PROVIDER_SITE_OTHER): Payer: Medicare Other | Admitting: Student

## 2018-06-22 ENCOUNTER — Encounter: Payer: Self-pay | Admitting: Student

## 2018-06-22 VITALS — BP 134/90 | HR 88 | Ht 69.0 in | Wt 254.6 lb

## 2018-06-22 DIAGNOSIS — R0789 Other chest pain: Secondary | ICD-10-CM | POA: Diagnosis not present

## 2018-06-22 DIAGNOSIS — R55 Syncope and collapse: Secondary | ICD-10-CM

## 2018-06-22 DIAGNOSIS — I10 Essential (primary) hypertension: Secondary | ICD-10-CM

## 2018-06-22 DIAGNOSIS — G4730 Sleep apnea, unspecified: Secondary | ICD-10-CM | POA: Diagnosis not present

## 2018-06-22 DIAGNOSIS — Z720 Tobacco use: Secondary | ICD-10-CM

## 2018-06-22 DIAGNOSIS — R0683 Snoring: Secondary | ICD-10-CM

## 2018-06-22 DIAGNOSIS — R400 Somnolence: Secondary | ICD-10-CM

## 2018-06-22 NOTE — Patient Instructions (Addendum)
Medication Instructions:   Your physician recommends that you continue on your current medications as directed. Please refer to the Current Medication list given to you today.  Labwork:  NONE  Testing/Procedures: Your physician has recommended that you wear an event monitor for 14 days. Event monitors are medical devices that record the heart's electrical activity. Doctors most often [place] these monitors to diagnose arrhythmias. Arrhythmias are problems with the speed or rhythm of the heartbeat. The monitor is a small, portable device. You can wear one while you do your normal daily activities. This is usually used to diagnose what is causing palpitations/syncope (passing out). Your physician has recommended that you have a sleep study. This test records several body functions during sleep, including: brain activity, eye movement, oxygen and carbon dioxide blood levels, heart rate and rhythm, breathing rate and rhythm, the flow of air through your mouth and nose, snoring, body muscle movements, and chest and belly movement.  Follow-Up:  Your physician recommends that you schedule a follow-up appointment in: 4 months with Dr. Bronson Ing.  Any Other Special Instructions Will Be Listed Below (If Applicable).  If you need a refill on your cardiac medications before your next appointment, please call your pharmacy.

## 2018-06-29 DIAGNOSIS — M545 Low back pain: Secondary | ICD-10-CM | POA: Diagnosis not present

## 2018-06-29 DIAGNOSIS — M5412 Radiculopathy, cervical region: Secondary | ICD-10-CM | POA: Diagnosis not present

## 2018-06-29 DIAGNOSIS — I10 Essential (primary) hypertension: Secondary | ICD-10-CM | POA: Diagnosis not present

## 2018-06-29 DIAGNOSIS — M542 Cervicalgia: Secondary | ICD-10-CM | POA: Diagnosis not present

## 2018-07-01 ENCOUNTER — Encounter (HOSPITAL_COMMUNITY): Payer: Self-pay

## 2018-07-01 ENCOUNTER — Emergency Department (HOSPITAL_COMMUNITY)
Admission: EM | Admit: 2018-07-01 | Discharge: 2018-07-02 | Disposition: A | Discharge status: Home / Self Care | Payer: Medicare Other | Attending: Emergency Medicine | Admitting: Emergency Medicine

## 2018-07-01 DIAGNOSIS — J449 Chronic obstructive pulmonary disease, unspecified: Secondary | ICD-10-CM | POA: Insufficient documentation

## 2018-07-01 DIAGNOSIS — R42 Dizziness and giddiness: Secondary | ICD-10-CM | POA: Diagnosis not present

## 2018-07-01 DIAGNOSIS — F17210 Nicotine dependence, cigarettes, uncomplicated: Secondary | ICD-10-CM | POA: Insufficient documentation

## 2018-07-01 DIAGNOSIS — E119 Type 2 diabetes mellitus without complications: Secondary | ICD-10-CM | POA: Diagnosis not present

## 2018-07-01 DIAGNOSIS — Z79899 Other long term (current) drug therapy: Secondary | ICD-10-CM | POA: Insufficient documentation

## 2018-07-01 DIAGNOSIS — I10 Essential (primary) hypertension: Secondary | ICD-10-CM | POA: Insufficient documentation

## 2018-07-01 DIAGNOSIS — Z7984 Long term (current) use of oral hypoglycemic drugs: Secondary | ICD-10-CM | POA: Diagnosis not present

## 2018-07-01 DIAGNOSIS — G4489 Other headache syndrome: Secondary | ICD-10-CM | POA: Diagnosis not present

## 2018-07-01 DIAGNOSIS — R0902 Hypoxemia: Secondary | ICD-10-CM | POA: Diagnosis not present

## 2018-07-01 DIAGNOSIS — R5381 Other malaise: Secondary | ICD-10-CM | POA: Diagnosis not present

## 2018-07-01 HISTORY — DX: Type 2 diabetes mellitus without complications: E11.9

## 2018-07-01 NOTE — ED Triage Notes (Signed)
Pt states he had got up from his chair to go take a shower and had onset of dizziness and lightheadedness.   Pt states he called his doctor and gave his symptoms and they told him to get checked out.  Pt also has a mild headache.

## 2018-07-02 DIAGNOSIS — R42 Dizziness and giddiness: Secondary | ICD-10-CM | POA: Diagnosis not present

## 2018-07-02 LAB — CBC WITH DIFFERENTIAL/PLATELET
Basophils Absolute: 0.1 10*3/uL (ref 0.0–0.1)
Basophils Relative: 1 %
Eosinophils Absolute: 0.2 10*3/uL (ref 0.0–0.7)
Eosinophils Relative: 2 %
HEMATOCRIT: 44.9 % (ref 39.0–52.0)
HEMOGLOBIN: 15.9 g/dL (ref 13.0–17.0)
LYMPHS ABS: 2.9 10*3/uL (ref 0.7–4.0)
Lymphocytes Relative: 30 %
MCH: 31.1 pg (ref 26.0–34.0)
MCHC: 35.4 g/dL (ref 30.0–36.0)
MCV: 87.7 fL (ref 78.0–100.0)
MONOS PCT: 8 %
Monocytes Absolute: 0.8 10*3/uL (ref 0.1–1.0)
NEUTROS ABS: 5.9 10*3/uL (ref 1.7–7.7)
NEUTROS PCT: 59 %
Platelets: 247 10*3/uL (ref 150–400)
RBC: 5.12 MIL/uL (ref 4.22–5.81)
RDW: 12.6 % (ref 11.5–15.5)
WBC: 9.9 10*3/uL (ref 4.0–10.5)

## 2018-07-02 LAB — BASIC METABOLIC PANEL
ANION GAP: 9 (ref 5–15)
BUN: 10 mg/dL (ref 6–20)
CHLORIDE: 100 mmol/L (ref 98–111)
CO2: 26 mmol/L (ref 22–32)
Calcium: 9.3 mg/dL (ref 8.9–10.3)
Creatinine, Ser: 0.75 mg/dL (ref 0.61–1.24)
GFR calc non Af Amer: >60 mL/min (ref >60)
Glucose, Bld: 152 mg/dL — ABNORMAL HIGH (ref 70–99)
POTASSIUM: 3.5 mmol/L (ref 3.5–5.1)
Sodium: 135 mmol/L (ref 135–145)

## 2018-07-02 MED ORDER — MECLIZINE HCL 12.5 MG PO TABS
25.0000 mg | ORAL_TABLET | Freq: Once | ORAL | Status: AC
  Administered 2018-07-02: 25 mg via ORAL
  Filled 2018-07-02: qty 2

## 2018-07-02 NOTE — ED Provider Notes (Signed)
[facility] EMERGENCY DEPARTMENT Provider Note   CSN: [id number] Arrival date & time: 07/01/18  2351     History   Chief Complaint Chief Complaint  Patient presents with  . Dizziness    HPI [name] is a 42 y.o. male.  The history is provided by the patient.  He has history of hypertension, diabetes, COPD, chronic pain and comes in because of dizziness.  He was watching television and noted he got very lightheaded when he stood up.  He denies chest pain, heaviness, tightness, pressure.  He denies any palpitations.  Nothing seemed to make the dizziness better or worse.  He tried taking a shower which actually made his dizziness worse.  He denies nausea or vomiting.  Denies being off balance.  He tried lying down but noticed his dizziness got better when he opened his eyes.  He did not lose consciousness.  This is somewhat similar to an episode of syncope that he had several weeks ago except that he had palpitations at that time.  He has seen his cardiologist who ordered a heart monitor, but he has not attached it because he is not sure how to put it on.  He called EMS and they noted significantly elevated blood pressure.  He is worried about a stroke because he had a family member who had transient ischemic attacks.  Past Medical History:  Diagnosis Date  . Chronic pain   . COPD (chronic obstructive pulmonary disease) ([place])   . Current every day smoker   . Diabetes mellitus without complication ([place])   . High blood pressure   . Hypoglycemia   . Opioid dependence [facility])     Patient Active Problem List   Diagnosis Date Noted  . Uncontrolled type 2 diabetes mellitus with complication, without long-term current use of insulin ([place]) 04/27/2017  . Fever, unknown origin 08/09/2016  . Severe sepsis ([place]) 08/09/2016  . Rigors 08/09/2016  . Current every day smoker 08/09/2016  . Hypokalemia 08/09/2016  . COPD (chronic obstructive pulmonary disease) ([place])   . Chronic pain   .  High blood pressure   . Opioid dependence ([place])   . Speech abnormality 11/08/2014  . Hypertension 11/08/2014  . Spinal stenosis in cervical region 04/30/2011  . Shoulder pain 04/30/2011  . INGROWN NAIL 12/18/2010  . SHOULDER PAIN 12/18/2010  . IMPINGEMENT SYNDROME 12/18/2010  . LATERAL EPICONDYLITIS 12/18/2010    Past Surgical History:  Procedure Laterality Date  . BACK SURGERY    . EYE SURGERY          Home Medications    Prior to Admission medications   Medication Sig Start Date End Date Taking? Authorizing Provider  amLODipine (NORVASC) 10 MG tablet Take 10 mg by mouth daily. 07/29/16   [provider]  baclofen (LIORESAL) 10 MG tablet Take 10 mg by mouth 3 (three) times daily. 08/04/16   [provider]  benazepril-hydrochlorthiazide (LOTENSIN HCT) 10-12.5 MG tablet Take 1 tablet by mouth daily. 07/29/16   [provider]  CHANTIX STARTING MONTH PAK 0.5 MG X 11 & 1 MG X 42 tablet Take 1 tablet by mouth as directed. 04/27/18   [provider]  gabapentin (NEURONTIN) 600 MG tablet Take 600 mg by mouth 4 (four) times daily. 08/04/16   [provider]  ibuprofen (ADVIL,MOTRIN) 200 MG tablet Take 800 mg by mouth every 6 (six) hours as needed for moderate pain.    [provider]  LORazepam (ATIVAN) 0.5 MG tablet Take  1 tablet by mouth daily as needed. 04/27/18   [provider]  metFORMIN (GLUCOPHAGE-XR) 500 MG 24 hr tablet Take 500 mg by mouth daily with breakfast.    [provider]  omeprazole (PRILOSEC) 20 MG capsule Take 20 mg by mouth daily. 07/29/16   [provider]  oxymorphone (OPANA) 5 MG tablet Take 5 mg by mouth 4 (four) times daily.  09/27/14   [provider]  SPIRIVA HANDIHALER 18 MCG inhalation capsule Place 18 mcg into inhaler and inhale daily. 08/04/16   [provider]    Family History Family History  Problem Relation Age of Onset  . Heart Problems Mother   . Heart  attack Father   . Diabetes Father   . Asthma Sister     Social History Social History   Tobacco Use  . Smoking status: Current Every Day Smoker    Packs/day: 2.00    Years: 21.00    Pack years: 42.00    Types: Cigarettes  . Smokeless tobacco: Never Used  Substance Use Topics  . Alcohol use: No  . Drug use: No     Allergies   Clindamycin/lincomycin   Review of Systems Review of Systems  All other systems reviewed and are negative.    Physical Exam Updated Vital Signs BP (!) 158/94 (BP Location: Right Arm)   Pulse 99   Temp 99.8 F (37.7 C) (Oral)   Resp 18   Ht 5\' 9"  (1.753 m)   Wt 115.2 kg   SpO2 95%   BMI 37.51 kg/m   Physical Exam  Nursing note and vitals reviewed.  42 year old male, resting comfortably and in no acute distress. Vital signs are significant for elevated blood pressure. Oxygen saturation is 95%, which is normal. Head is normocephalic and atraumatic. PERRLA, EOMI. There is no nystagmus.  Oropharynx is clear. Neck is nontender and supple without adenopathy or JVD.  There are no carotid bruits. Back is nontender and there is no CVA tenderness. Lungs are clear without rales, wheezes, or rhonchi. Chest is nontender. Heart has regular rate and rhythm without murmur. Abdomen is soft, flat, nontender without masses or hepatosplenomegaly and peristalsis is normoactive. Extremities have no cyanosis or edema, full range of motion is present. Skin is warm and dry without rash. Neurologic: Mental status is normal, cranial nerves are intact, there are no motor or sensory deficits.  Dizziness is reproduced by passive head movement.  ED Treatments / Results  Labs (all labs ordered are listed, but only abnormal results are displayed) Labs Reviewed  BASIC METABOLIC PANEL - Abnormal; Notable for the following components:      Result Value   Glucose, Bld 152 (*)    All other components within normal limits  CBC WITH DIFFERENTIAL/PLATELET    EKG EKG  Interpretation  Date/Time:  Friday July 02 2018 00:28:01 EDT Ventricular Rate:  86 PR Interval:    QRS Duration: 130 QT Interval:  390 QTC Calculation: 467 R Axis:   -62 Text Interpretation:  Sinus rhythm RBBB and LAFB When compared with ECG of 08/09/2016, No significant change was found Confirmed by Delora Fuel ([id number]) on 07/02/2018 12:53:23 AM  Procedures Procedures  Medications Ordered in ED Medications  meclizine (ANTIVERT) tablet 25 mg (25 mg Oral Given 07/02/18 0013)   Initial Impression / Assessment and Plan / ED Course  I have reviewed the triage vital signs and the nursing notes.  Pertinent lab results that were available during my care of the  patient were reviewed by me and considered in my medical decision making (see chart for details).  Dizziness which seems labyrinthine based on reproducing symptoms by passive head movement.  Will check screening labs and orthostatic vital signs.  We will give therapeutic trial of meclizine.  Old records are reviewed confirming recent syncopal episode and outpatient evaluation by cardiology.  Work-up to this point had included normal nuclear stress test and echocardiogram 1 year ago.  ECG is unchanged from baseline.  CBC and metabolic panel are unremarkable.  Orthostatic vital signs showed no significant change in pulse or blood pressure.  He had no improvement in symptoms with meclizine.  At this point, I see no evidence of serious pathology.  He is discharged with instructions to follow-up with his cardiologist and his primary care provider.  Final Clinical Impressions(s) / ED Diagnoses   Final diagnoses:  Lightheadedness    ED Discharge Orders    None       Delora Fuel, MD 41/63/84 [phone]

## 2018-07-02 NOTE — ED Notes (Signed)
EKG given to Dr. Glick. 

## 2018-07-02 NOTE — Discharge Instructions (Addendum)
Return if symptoms are getting worse. °

## 2018-07-05 DIAGNOSIS — R42 Dizziness and giddiness: Secondary | ICD-10-CM | POA: Diagnosis not present

## 2018-07-05 DIAGNOSIS — E119 Type 2 diabetes mellitus without complications: Secondary | ICD-10-CM | POA: Diagnosis not present

## 2018-07-05 DIAGNOSIS — Z0000 Encounter for general adult medical examination without abnormal findings: Secondary | ICD-10-CM | POA: Diagnosis not present

## 2018-07-08 ENCOUNTER — Ambulatory Visit: Payer: Medicare Other

## 2018-07-08 ENCOUNTER — Ambulatory Visit (INDEPENDENT_AMBULATORY_CARE_PROVIDER_SITE_OTHER): Payer: Medicare Other

## 2018-07-08 DIAGNOSIS — R55 Syncope and collapse: Secondary | ICD-10-CM | POA: Diagnosis not present

## 2018-07-23 ENCOUNTER — Encounter (HOSPITAL_BASED_OUTPATIENT_CLINIC_OR_DEPARTMENT_OTHER): Payer: Medicaid Other

## 2018-08-09 DIAGNOSIS — I10 Essential (primary) hypertension: Secondary | ICD-10-CM | POA: Diagnosis not present

## 2018-08-09 DIAGNOSIS — M5416 Radiculopathy, lumbar region: Secondary | ICD-10-CM | POA: Diagnosis not present

## 2018-08-10 ENCOUNTER — Telehealth: Payer: Self-pay | Admitting: *Deleted

## 2018-08-10 NOTE — Telephone Encounter (Signed)
Called patient with test results. No answer. Left message to call back.  

## 2018-08-10 NOTE — Telephone Encounter (Signed)
-----   Message from Erma Heritage, [place] sent at 08/06/2018 12:05 PM EDT ----- Please let the patient know his event monitor showed normal sinus rhythm with no significant arrhythmias to account for his recent syncopal event. Thank you.

## 2018-08-18 ENCOUNTER — Encounter (HOSPITAL_BASED_OUTPATIENT_CLINIC_OR_DEPARTMENT_OTHER): Payer: Medicaid Other

## 2018-08-19 ENCOUNTER — Emergency Department (HOSPITAL_COMMUNITY): Payer: Medicare Other

## 2018-08-19 ENCOUNTER — Encounter (HOSPITAL_COMMUNITY): Payer: Self-pay | Admitting: *Deleted

## 2018-08-19 ENCOUNTER — Other Ambulatory Visit: Payer: Self-pay

## 2018-08-19 ENCOUNTER — Emergency Department (HOSPITAL_COMMUNITY)
Admission: EM | Admit: 2018-08-19 | Discharge: 2018-08-19 | Disposition: A | Discharge status: Home / Self Care | Payer: Medicare Other | Attending: Emergency Medicine | Admitting: Emergency Medicine

## 2018-08-19 DIAGNOSIS — J449 Chronic obstructive pulmonary disease, unspecified: Secondary | ICD-10-CM | POA: Insufficient documentation

## 2018-08-19 DIAGNOSIS — E119 Type 2 diabetes mellitus without complications: Secondary | ICD-10-CM | POA: Diagnosis not present

## 2018-08-19 DIAGNOSIS — H66001 Acute suppurative otitis media without spontaneous rupture of ear drum, right ear: Secondary | ICD-10-CM | POA: Insufficient documentation

## 2018-08-19 DIAGNOSIS — I10 Essential (primary) hypertension: Secondary | ICD-10-CM | POA: Diagnosis not present

## 2018-08-19 DIAGNOSIS — M25572 Pain in left ankle and joints of left foot: Secondary | ICD-10-CM | POA: Diagnosis not present

## 2018-08-19 DIAGNOSIS — F17210 Nicotine dependence, cigarettes, uncomplicated: Secondary | ICD-10-CM | POA: Diagnosis not present

## 2018-08-19 DIAGNOSIS — Z7984 Long term (current) use of oral hypoglycemic drugs: Secondary | ICD-10-CM | POA: Diagnosis not present

## 2018-08-19 DIAGNOSIS — M25571 Pain in right ankle and joints of right foot: Secondary | ICD-10-CM | POA: Diagnosis not present

## 2018-08-19 DIAGNOSIS — Z79899 Other long term (current) drug therapy: Secondary | ICD-10-CM | POA: Diagnosis not present

## 2018-08-19 MED ORDER — PENICILLIN V POTASSIUM 500 MG PO TABS: 500.0000 mg | ORAL_TABLET | Freq: Two times a day (BID) | ORAL | 0 refills | Status: DC

## 2018-08-19 MED ORDER — DEXAMETHASONE 4 MG PO TABS
8.0000 mg | ORAL_TABLET | Freq: Once | ORAL | Status: AC
  Administered 2018-08-19: 8 mg via ORAL
  Filled 2018-08-19: qty 2

## 2018-08-19 NOTE — ED Provider Notes (Signed)
[facility] EMERGENCY DEPARTMENT Provider Note   CSN: [id number] Arrival date & time: 08/19/18  0128     History   Chief Complaint Chief Complaint  Patient presents with  . Ankle Pain    HPI [name] is a 42 y.o. male.  The history is provided by the patient.  Ankle Pain   The incident occurred more than 1 week ago. There was no injury mechanism. The pain is present in the right ankle. The pain is moderate. The pain has been constant since onset. Associated symptoms include inability to bear weight. The symptoms are aggravated by activity and bearing weight.   He  reports has had right ankle pain for several weeks of unclear etiology.  Denies fall or trauma.  Denies calf pain or swelling. He reports in the past 24 hours is become acutely worse.  His home pain medications have not helped.  He denies known history of gout.  Also reports recent cough congestion and right ear pain.  He suspects he has an upper respiratory infection Past Medical History:  Diagnosis Date  . Chronic pain   . COPD (chronic obstructive pulmonary disease) ([place])   . Current every day smoker   . Diabetes mellitus without complication ([place])   . High blood pressure   . Hypoglycemia   . Opioid dependence [facility])     Patient Active Problem List   Diagnosis Date Noted  . Uncontrolled type 2 diabetes mellitus with complication, without long-term current use of insulin ([place]) 04/27/2017  . Fever, unknown origin 08/09/2016  . Severe sepsis ([place]) 08/09/2016  . Rigors 08/09/2016  . Current every day smoker 08/09/2016  . Hypokalemia 08/09/2016  . COPD (chronic obstructive pulmonary disease) ([place])   . Chronic pain   . High blood pressure   . Opioid dependence ([place])   . Speech abnormality 11/08/2014  . Hypertension 11/08/2014  . Spinal stenosis in cervical region 04/30/2011  . Shoulder pain 04/30/2011  . INGROWN NAIL 12/18/2010  . SHOULDER PAIN 12/18/2010  . IMPINGEMENT SYNDROME 12/18/2010  .  LATERAL EPICONDYLITIS 12/18/2010    Past Surgical History:  Procedure Laterality Date  . BACK SURGERY    . EYE SURGERY          Home Medications    Prior to Admission medications   Medication Sig Start Date End Date Taking? Authorizing Provider  amLODipine (NORVASC) 10 MG tablet Take 10 mg by mouth daily. 07/29/16   [provider]  baclofen (LIORESAL) 10 MG tablet Take 10 mg by mouth 3 (three) times daily. 08/04/16   [provider]  benazepril-hydrochlorthiazide (LOTENSIN HCT) 10-12.5 MG tablet Take 1 tablet by mouth daily. 07/29/16   [provider]  CHANTIX STARTING MONTH PAK 0.5 MG X 11 & 1 MG X 42 tablet Take 1 tablet by mouth as directed. 04/27/18   [provider]  gabapentin (NEURONTIN) 600 MG tablet Take 600 mg by mouth 4 (four) times daily. 08/04/16   [provider]  ibuprofen (ADVIL,MOTRIN) 200 MG tablet Take 800 mg by mouth every 6 (six) hours as needed for moderate pain.    [provider]  LORazepam (ATIVAN) 0.5 MG tablet Take 1 tablet by mouth daily as needed. 04/27/18   [provider]  metFORMIN (GLUCOPHAGE-XR) 500 MG 24 hr tablet Take 500 mg by mouth daily with breakfast.    [provider]  omeprazole (PRILOSEC) 20 MG capsule Take 20 mg by mouth daily. 07/29/16   [provider]  oxymorphone (OPANA) 5 MG tablet Take 5 mg by mouth 4 (four) times daily.  09/27/14   [provider]  penicillin v potassium (VEETID) 500 MG tablet Take 1 tablet (500 mg total) by mouth 2 (two) times daily. 08/19/18   Ripley Fraise, MD  SPIRIVA HANDIHALER 18 MCG inhalation capsule Place 18 mcg into inhaler and inhale daily. 08/04/16   [provider]    Family History Family History  Problem Relation Age of Onset  . Heart Problems Mother   . Heart attack Father   . Diabetes Father   . Asthma Sister     Social History Social History   Tobacco Use  . Smoking status: Current Every Day  Smoker    Packs/day: 2.00    Years: 21.00    Pack years: 42.00    Types: Cigarettes  . Smokeless tobacco: Never Used  Substance Use Topics  . Alcohol use: No  . Drug use: No     Allergies   Clindamycin/lincomycin   Review of Systems Review of Systems  Constitutional: Negative for fever.  HENT: Positive for ear pain.   Musculoskeletal: Positive for arthralgias.  All other systems reviewed and are negative.    Physical Exam Updated Vital Signs BP (!) 159/85 (BP Location: Left Arm)   Pulse 87   Temp 98.1 F (36.7 C) (Oral)   Resp 17   Ht 1.753 m (5\' 9" )   Wt 115.2 kg   SpO2 96%   BMI 37.51 kg/m   Physical Exam CONSTITUTIONAL: Well developed/well nourished HEAD: Normocephalic/atraumatic EYES: EOMI/PERRL ENMT: Mucous membranes moist, right TM erythematous and bulging, it is intact NECK: supple no meningeal signs CV: S1/S2 noted, no murmurs/rubs/gallops noted LUNGS: Lungs are clear to auscultation bilaterally, no apparent distress ABDOMEN: soft, nontender  NEURO: Pt is awake/alert/appropriate, moves all extremitiesx4.  No facial droop.   EXTREMITIES: pulses normal/equal, full ROM, tenderness to right medial malleolus, minimal swelling, no erythema.  He can fully flex and extend at the ankle.  Distal pulses intact.  There is no calf tenderness or edema on the right. SKIN: warm, color normal PSYCH: no abnormalities of mood noted, alert and oriented to situation   ED Treatments / Results  Labs (all labs ordered are listed, but only abnormal results are displayed) Labs Reviewed - No data to display  EKG None  Radiology Dg Ankle Complete Right  Result Date: 08/19/2018 CLINICAL DATA:  Medial and anterior right ankle pain for a few weeks. No known injury. Possible previous fracture in the past. EXAM: RIGHT ANKLE - COMPLETE 3+ VIEW COMPARISON:  None. 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 No evidence of acute fracture or dislocation of the  right ankle. No focal bone lesion or bone destruction. Bone cortex appears intact. Soft tissues are unremarkable. IMPRESSION: No acute bony abnormalities. Electronically Signed   By: Lucienne Capers M.D.   On: 08/19/2018 02:49    Procedures Procedures   Medications Ordered in ED Medications  dexamethasone (DECADRON) tablet 8 mg (8 mg Oral Given 08/19/18 0411)     Initial Impression / Assessment and Plan / ED Course  I have reviewed the triage vital signs and the nursing notes.  Pertinent  imaging results that were available during my care of the patient were reviewed by me and considered in my medical decision making (see chart for details).     Unclear etiology of right ankle pain.  No signs of septic joint.  No bony injury noted.  Could  be arthritis of unclear etiology, possible gout, will start steroids.  Patient is reluctant to start daily steroids, but is amenable to a one-time dose of Decadron.  We will also treat for otitis media in right ear, he request penicillin.  Final Clinical Impressions(s) / ED Diagnoses   Final diagnoses:  Acute left ankle pain  Non-recurrent acute suppurative otitis media of right ear without spontaneous rupture of tympanic membrane    ED Discharge Orders         Ordered    penicillin v potassium (VEETID) 500 MG tablet  2 times daily     08/19/18 0358           Ripley Fraise, MD 08/19/18 [phone]

## 2018-08-19 NOTE — ED Triage Notes (Signed)
Pt c/o severe ankle pain to right foot; pt states it has been sore for the last couple weeks but today has become very painful; pt is also having nasal congestion and decreased hearing to right ear

## 2018-08-19 NOTE — ED Notes (Signed)
Patient refused crutches.

## 2018-08-25 DIAGNOSIS — L03115 Cellulitis of right lower limb: Secondary | ICD-10-CM | POA: Diagnosis not present

## 2018-08-31 DIAGNOSIS — L03115 Cellulitis of right lower limb: Secondary | ICD-10-CM | POA: Diagnosis not present

## 2018-08-31 DIAGNOSIS — M25571 Pain in right ankle and joints of right foot: Secondary | ICD-10-CM | POA: Diagnosis not present

## 2018-09-01 ENCOUNTER — Other Ambulatory Visit: Payer: Self-pay | Admitting: Physician Assistant

## 2018-09-01 DIAGNOSIS — M5416 Radiculopathy, lumbar region: Secondary | ICD-10-CM

## 2018-09-08 DIAGNOSIS — H60311 Diffuse otitis externa, right ear: Secondary | ICD-10-CM | POA: Diagnosis not present

## 2018-09-08 DIAGNOSIS — H66001 Acute suppurative otitis media without spontaneous rupture of ear drum, right ear: Secondary | ICD-10-CM | POA: Diagnosis not present

## 2018-09-21 ENCOUNTER — Encounter (HOSPITAL_BASED_OUTPATIENT_CLINIC_OR_DEPARTMENT_OTHER): Payer: Medicare Other

## 2018-09-21 DIAGNOSIS — H109 Unspecified conjunctivitis: Secondary | ICD-10-CM | POA: Diagnosis not present

## 2018-09-23 DIAGNOSIS — M25471 Effusion, right ankle: Secondary | ICD-10-CM | POA: Diagnosis not present

## 2018-09-23 DIAGNOSIS — R52 Pain, unspecified: Secondary | ICD-10-CM | POA: Diagnosis not present

## 2018-09-23 DIAGNOSIS — M659 Synovitis and tenosynovitis, unspecified: Secondary | ICD-10-CM | POA: Diagnosis not present

## 2018-09-23 DIAGNOSIS — L03119 Cellulitis of unspecified part of limb: Secondary | ICD-10-CM | POA: Diagnosis not present

## 2018-09-24 ENCOUNTER — Ambulatory Visit
Admission: RE | Admit: 2018-09-24 | Discharge: 2018-09-24 | Disposition: A | Discharge status: Home / Self Care | Payer: Medicare Other | Source: Ambulatory Visit | Attending: Physician Assistant | Admitting: Physician Assistant

## 2018-09-24 DIAGNOSIS — M4807 Spinal stenosis, lumbosacral region: Secondary | ICD-10-CM | POA: Diagnosis not present

## 2018-09-24 DIAGNOSIS — M5416 Radiculopathy, lumbar region: Secondary | ICD-10-CM

## 2018-09-24 MED ORDER — GADOBENATE DIMEGLUMINE 529 MG/ML IV SOLN
20.0000 mL | Freq: Once | INTRAVENOUS | Status: AC | PRN
  Administered 2018-09-24: 20 mL via INTRAVENOUS

## 2018-09-27 DIAGNOSIS — I10 Essential (primary) hypertension: Secondary | ICD-10-CM | POA: Diagnosis not present

## 2018-09-27 DIAGNOSIS — M545 Low back pain: Secondary | ICD-10-CM | POA: Diagnosis not present

## 2018-10-01 DIAGNOSIS — R609 Edema, unspecified: Secondary | ICD-10-CM | POA: Diagnosis not present

## 2018-10-01 DIAGNOSIS — R52 Pain, unspecified: Secondary | ICD-10-CM | POA: Diagnosis not present

## 2018-10-01 DIAGNOSIS — L03119 Cellulitis of unspecified part of limb: Secondary | ICD-10-CM | POA: Diagnosis not present

## 2018-10-01 DIAGNOSIS — M6589 Other synovitis and tenosynovitis, multiple sites: Secondary | ICD-10-CM | POA: Diagnosis not present

## 2018-10-01 DIAGNOSIS — L03115 Cellulitis of right lower limb: Secondary | ICD-10-CM | POA: Diagnosis not present

## 2018-10-18 DIAGNOSIS — M5416 Radiculopathy, lumbar region: Secondary | ICD-10-CM | POA: Diagnosis not present

## 2018-10-18 DIAGNOSIS — I10 Essential (primary) hypertension: Secondary | ICD-10-CM | POA: Diagnosis not present

## 2018-10-25 ENCOUNTER — Encounter: Payer: Self-pay | Admitting: Cardiovascular Disease

## 2018-10-25 ENCOUNTER — Ambulatory Visit (INDEPENDENT_AMBULATORY_CARE_PROVIDER_SITE_OTHER): Payer: Medicare Other | Admitting: Cardiovascular Disease

## 2018-10-25 VITALS — BP 124/76 | HR 87 | Ht 69.0 in | Wt 257.0 lb

## 2018-10-25 DIAGNOSIS — R55 Syncope and collapse: Secondary | ICD-10-CM

## 2018-10-25 DIAGNOSIS — F17210 Nicotine dependence, cigarettes, uncomplicated: Secondary | ICD-10-CM | POA: Diagnosis not present

## 2018-10-25 DIAGNOSIS — I10 Essential (primary) hypertension: Secondary | ICD-10-CM | POA: Diagnosis not present

## 2018-10-25 DIAGNOSIS — G4730 Sleep apnea, unspecified: Secondary | ICD-10-CM

## 2018-10-25 DIAGNOSIS — R0789 Other chest pain: Secondary | ICD-10-CM

## 2018-10-25 DIAGNOSIS — Z716 Tobacco abuse counseling: Secondary | ICD-10-CM

## 2018-10-25 MED ORDER — VARENICLINE TARTRATE 0.5 MG X 11 & 1 MG X 42 PO MISC: ORAL | 0 refills | Status: DC

## 2018-10-25 NOTE — Patient Instructions (Signed)
Medication Instructions:  Take Chantix as directed If you need a refill on your cardiac medications before your next appointment, please call your pharmacy.   Lab work: None If you have labs (blood work) drawn today and your tests are completely normal, you will receive your results only by: Marland Kitchen MyChart Message (if you have MyChart) OR . A paper copy in the mail If you have any lab test that is abnormal or we need to change your treatment, we will call you to review the results.  Testing/Procedures: None  Follow-Up: as needed  Any Other Special Instructions Will Be Listed Below (If Applicable). NONE

## 2018-10-25 NOTE — Progress Notes (Signed)
SUBJECTIVE: The patient returns for follow-up after undergoing cardiovascular testing performed for the evaluation of syncope.  14-day event monitor demonstrated sinus rhythm with no significant arrhythmias.  The patient denies any symptoms of chest pain, palpitations, shortness of breath, lightheadedness, dizziness, leg swelling, orthopnea, PND, and syncope.  He is trying to exercise much more doing cardio and weights and has lost 5 pounds in the past 2 weeks.  He said he gets very poor sleep and was up from midnight till 5 AM last night.  He smokes 2 packs of cigarettes daily.  He had been on Chantix before and it was helping him reduce cigarette consumption but then had a syncopal episode and was told to stop taking this.  He would like to get back on it again.  He is single and would like to get into a relationship again.     Review of Systems: As per "subjective", otherwise negative.  Allergies  Allergen Reactions  . Clindamycin/Lincomycin Nausea Only    "feel funny in head"    Current Outpatient Medications  Medication Sig Dispense Refill  . amLODipine (NORVASC) 10 MG tablet Take 10 mg by mouth daily.    . baclofen (LIORESAL) 10 MG tablet Take 10 mg by mouth 3 (three) times daily.    . benazepril-hydrochlorthiazide (LOTENSIN HCT) 10-12.5 MG tablet Take 1 tablet by mouth daily.    . diclofenac (VOLTAREN) 75 MG EC tablet Take 1 tablet by mouth 2 (two) times daily.    Marland Kitchen gabapentin (NEURONTIN) 600 MG tablet Take 600 mg by mouth 4 (four) times daily.    Marland Kitchen ibuprofen (ADVIL,MOTRIN) 200 MG tablet Take 800 mg by mouth every 6 (six) hours as needed for moderate pain.    Marland Kitchen LORazepam (ATIVAN) 0.5 MG tablet Take 1 tablet by mouth daily as needed.    Marland Kitchen omeprazole (PRILOSEC) 20 MG capsule Take 1 capsule by mouth daily.    Marland Kitchen oxymorphone (OPANA) 5 MG tablet Take 5 mg by mouth 4 (four) times daily.   0  . SPIRIVA HANDIHALER 18 MCG inhalation capsule Place 18 mcg into inhaler and  inhale daily.     No current facility-administered medications for this visit.     Past Medical History:  Diagnosis Date  . Chronic pain   . COPD (chronic obstructive pulmonary disease) ([place])   . Current every day smoker   . Diabetes mellitus without complication ([place])   . High blood pressure   . Hypoglycemia   . Opioid dependence ([place])     Past Surgical History:  Procedure Laterality Date  . BACK SURGERY    . EYE SURGERY      Social History   Socioeconomic History  . Marital status: Legally Separated    Spouse name: Not on file  . Number of children: Not on file  . Years of education: Not on file  . Highest education level: Not on file  Occupational History  . Occupation: retired./disabled  Social Needs  . Financial resource strain: Not on file  . Food insecurity:    Worry: Not on file    Inability: Not on file  . Transportation needs:    Medical: Not on file    Non-medical: Not on file  Tobacco Use  . Smoking status: Current Every Day Smoker    Packs/day: 2.00    Years: 21.00    Pack years: 42.00    Types: Cigarettes  . Smokeless tobacco: Never Used  Substance and Sexual Activity  .  Alcohol use: No  . Drug use: No  . Sexual activity: Yes  Lifestyle  . Physical activity:    Days per week: Not on file    Minutes per session: Not on file  . Stress: Not on file  Relationships  . Social connections:    Talks on phone: Not on file    Gets together: Not on file    Attends religious service: Not on file    Active member of club or organization: Not on file    Attends meetings of clubs or organizations: Not on file    Relationship status: Not on file  . Intimate partner violence:    Fear of current or ex partner: Not on file    Emotionally abused: Not on file    Physically abused: Not on file    Forced sexual activity: Not on file  Other Topics Concern  . Not on file  Social History Narrative  . Not on file     Vitals:   10/25/18 1048  BP: 124/76   Pulse: 87  SpO2: 96%  Weight: 257 lb (116.6 kg)  Height: '5\' 9"'$  (1.753 m)    Wt Readings from Last 3 Encounters:  10/25/18 257 lb (116.6 kg)  08/19/18 254 lb (115.2 kg)  07/01/18 254 lb (115.2 kg)     PHYSICAL EXAM General: NAD HEENT: Normal. Neck: No JVD, no thyromegaly. Lungs: Clear to auscultation bilaterally with normal respiratory effort. CV: Regular rate and rhythm, normal S1/S2, no S3/S4, no murmur. No pretibial or periankle edema.  No carotid bruit.   Abdomen: Soft, nontender, no distention.  Neurologic: Alert and oriented.  Psych: Normal affect. Skin: Normal. Musculoskeletal: No gross deformities.    ECG: Reviewed above under Subjective   Labs: Lab Results  Component Value Date/Time   K 3.5 07/02/2018 12:30 AM   BUN 10 07/02/2018 12:30 AM   CREATININE 0.75 07/02/2018 12:30 AM   ALT 22 08/10/2016 04:56 AM   HGB 15.9 07/02/2018 12:30 AM     Lipids: No results found for: LDLCALC, LDLDIRECT, CHOL, TRIG, HDL     ASSESSMENT AND PLAN: 1.  Syncope: Event monitoring did not demonstrate any arrhythmias.  No further cardiac testing is indicated at this time.  No symptom recurrence.  2.  Hypertension: Blood pressure is normal.  No changes to therapy.  3.  Atypical chest pain: He has had atypical chest discomfort for several years with a previously normal nuclear stress test in July 2018.  No further cardiac testing is indicated at this time.  4.  Sleep apnea: Sleep specialist referral made previously.  5.  Tobacco use: He smokes 2 packs of cigarettes daily. The patient was counseled on tobacco cessation today for 5 minutes.  Counseling included reviewing the risks of smoking tobacco products, how it impacts the patient's current medical diagnoses and different strategies for quitting.  Pharmacotherapy to aid in tobacco cessation was prescribed today in the form of a Chantix starter kit.Marland Kitchen     Disposition: Follow up as needed   Kate Sable, M.D.,  F.A.C.C.

## 2018-10-25 NOTE — Addendum Note (Signed)
Addended by: Barbarann Ehlers A on: 10/25/2018 11:29 AM   Modules accepted: Orders

## 2018-10-26 ENCOUNTER — Emergency Department (HOSPITAL_COMMUNITY)
Admission: EM | Admit: 2018-10-26 | Discharge: 2018-10-26 | Disposition: A | Discharge status: Home / Self Care | Payer: Medicare Other | Attending: Emergency Medicine | Admitting: Emergency Medicine

## 2018-10-26 ENCOUNTER — Other Ambulatory Visit: Payer: Self-pay

## 2018-10-26 ENCOUNTER — Encounter (HOSPITAL_COMMUNITY): Payer: Self-pay | Admitting: Emergency Medicine

## 2018-10-26 DIAGNOSIS — E119 Type 2 diabetes mellitus without complications: Secondary | ICD-10-CM | POA: Insufficient documentation

## 2018-10-26 DIAGNOSIS — J449 Chronic obstructive pulmonary disease, unspecified: Secondary | ICD-10-CM | POA: Diagnosis not present

## 2018-10-26 DIAGNOSIS — H6692 Otitis media, unspecified, left ear: Secondary | ICD-10-CM | POA: Diagnosis not present

## 2018-10-26 DIAGNOSIS — M545 Low back pain: Secondary | ICD-10-CM | POA: Diagnosis not present

## 2018-10-26 DIAGNOSIS — I10 Essential (primary) hypertension: Secondary | ICD-10-CM | POA: Diagnosis not present

## 2018-10-26 DIAGNOSIS — F17210 Nicotine dependence, cigarettes, uncomplicated: Secondary | ICD-10-CM | POA: Insufficient documentation

## 2018-10-26 DIAGNOSIS — Z79899 Other long term (current) drug therapy: Secondary | ICD-10-CM | POA: Insufficient documentation

## 2018-10-26 DIAGNOSIS — M5416 Radiculopathy, lumbar region: Secondary | ICD-10-CM | POA: Diagnosis not present

## 2018-10-26 DIAGNOSIS — H938X2 Other specified disorders of left ear: Secondary | ICD-10-CM | POA: Diagnosis present

## 2018-10-26 DIAGNOSIS — H6002 Abscess of left external ear: Secondary | ICD-10-CM | POA: Diagnosis not present

## 2018-10-26 DIAGNOSIS — H60392 Other infective otitis externa, left ear: Secondary | ICD-10-CM

## 2018-10-26 MED ORDER — ACETAMINOPHEN 500 MG PO TABS
1000.0000 mg | ORAL_TABLET | Freq: Once | ORAL | Status: AC
  Administered 2018-10-26: 1000 mg via ORAL
  Filled 2018-10-26: qty 2

## 2018-10-26 MED ORDER — KETOROLAC TROMETHAMINE 10 MG PO TABS
10.0000 mg | ORAL_TABLET | Freq: Once | ORAL | Status: DC
  Filled 2018-10-26: qty 1

## 2018-10-26 MED ORDER — DOXYCYCLINE HYCLATE 100 MG PO CAPS: 100.0000 mg | ORAL_CAPSULE | Freq: Two times a day (BID) | ORAL | 0 refills | Status: DC

## 2018-10-26 MED ORDER — MELOXICAM 15 MG PO TABS: 15.0000 mg | ORAL_TABLET | Freq: Every day | ORAL | 0 refills | Status: DC

## 2018-10-26 MED ORDER — ONDANSETRON HCL 4 MG PO TABS
4.0000 mg | ORAL_TABLET | Freq: Once | ORAL | Status: AC
  Administered 2018-10-26: 4 mg via ORAL
  Filled 2018-10-26: qty 1

## 2018-10-26 MED ORDER — DOXYCYCLINE HYCLATE 100 MG PO TABS
100.0000 mg | ORAL_TABLET | Freq: Once | ORAL | Status: AC
  Administered 2018-10-26: 100 mg via ORAL
  Filled 2018-10-26: qty 1

## 2018-10-26 MED ORDER — CEPHALEXIN 500 MG PO CAPS
500.0000 mg | ORAL_CAPSULE | Freq: Once | ORAL | Status: AC
  Administered 2018-10-26: 500 mg via ORAL
  Filled 2018-10-26: qty 1

## 2018-10-26 NOTE — ED Triage Notes (Signed)
Pt reports "bump" on R earlobe for the past 3 weeks. States he was "squeezing" it tonight and felt a pop in his earlobe and then began to have swelling.

## 2018-10-26 NOTE — Discharge Instructions (Signed)
Please apply a cool compress to your earlobe.  Use meloxicam 1 time daily with food over the next 5 days.  Use doxycycline 2 times daily until all taken.  Please have your ear rechecked in the next 3 to 5 days at your primary physician office, or return here to the emergency department for the recheck.

## 2018-10-26 NOTE — ED Provider Notes (Signed)
[facility] EMERGENCY DEPARTMENT Provider Note   CSN: [id number] Arrival date & time: 10/26/18  1952     History   Chief Complaint Chief Complaint  Patient presents with  . Abscess    HPI [name] is a 42 y.o. male.  Patient is a 42 year old male who presents to the emergency department with a complaint of swelling of the right ear.  The patient states that he has had a bump of the earlobe for quite some time.  At least for the past 3 weeks or more.  He says that it began to itch and he was squeezing it earlier tonight.  He felt a pop inside the earlobe, and initially there was only a small amount of pain, following this he began to have swelling and pain of the earlobe.  No swelling of the pinna portion of the ear.  No pain of the inner canal.  He is not had any bleeding or drainage that he is aware of.  There is been no recent fever or chills to be reported.  No previous operations or procedures involving the ear.  And is not had a bump in the earlobe like this in the past.  The history is provided by the patient.  Abscess    Past Medical History:  Diagnosis Date  . Chronic pain   . COPD (chronic obstructive pulmonary disease) ([place])   . Current every day smoker   . Diabetes mellitus without complication ([place])   . High blood pressure   . Hypoglycemia   . Opioid dependence [facility])     Patient Active Problem List   Diagnosis Date Noted  . Uncontrolled type 2 diabetes mellitus with complication, without long-term current use of insulin ([place]) 04/27/2017  . Fever, unknown origin 08/09/2016  . Severe sepsis ([place]) 08/09/2016  . Rigors 08/09/2016  . Current every day smoker 08/09/2016  . Hypokalemia 08/09/2016  . COPD (chronic obstructive pulmonary disease) ([place])   . Chronic pain   . High blood pressure   . Opioid dependence ([place])   . Speech abnormality 11/08/2014  . Hypertension 11/08/2014  . Spinal stenosis in cervical region 04/30/2011  . Shoulder pain  04/30/2011  . INGROWN NAIL 12/18/2010  . SHOULDER PAIN 12/18/2010  . IMPINGEMENT SYNDROME 12/18/2010  . LATERAL EPICONDYLITIS 12/18/2010    Past Surgical History:  Procedure Laterality Date  . BACK SURGERY    . EYE SURGERY          Home Medications    Prior to Admission medications   Medication Sig Start Date End Date Taking? Authorizing Provider  amLODipine (NORVASC) 10 MG tablet Take 10 mg by mouth daily. 07/29/16   [provider]  baclofen (LIORESAL) 10 MG tablet Take 10 mg by mouth 3 (three) times daily. 08/04/16   [provider]  benazepril-hydrochlorthiazide (LOTENSIN HCT) 10-12.5 MG tablet Take 1 tablet by mouth daily. 07/29/16   [provider]  diclofenac (VOLTAREN) 75 MG EC tablet Take 1 tablet by mouth 2 (two) times daily. 07/31/18   [provider]  gabapentin (NEURONTIN) 600 MG tablet Take 600 mg by mouth 4 (four) times daily. 08/04/16   [provider]  ibuprofen (ADVIL,MOTRIN) 200 MG tablet Take 800 mg by mouth every 6 (six) hours as needed for moderate pain.    [provider]  LORazepam (ATIVAN) 0.5 MG tablet Take 1 tablet by mouth daily as needed. 04/27/18   [provider]  omeprazole (PRILOSEC) 20 MG capsule Take 1  capsule by mouth daily. 07/30/18   [provider]  oxymorphone (OPANA) 5 MG tablet Take 5 mg by mouth 4 (four) times daily.  09/27/14   [provider]  SPIRIVA HANDIHALER 18 MCG inhalation capsule Place 18 mcg into inhaler and inhale daily. 08/04/16   [provider]  varenicline (CHANTIX STARTING MONTH PAK) 0.5 MG X 11 & 1 MG X 42 tablet Take one 0.5 mg tablet by mouth once daily for 3 days, then increase to one 0.5 mg tablet twice daily for 4 days, then increase to one 1 mg tablet twice daily. 10/25/18   Herminio Commons, MD    Family History Family History  Problem Relation Age of Onset  . Heart Problems Mother   . Heart attack Father   . Diabetes Father     . Asthma Sister     Social History Social History   Tobacco Use  . Smoking status: Current Every Day Smoker    Packs/day: 2.00    Years: 21.00    Pack years: 42.00    Types: Cigarettes  . Smokeless tobacco: Never Used  Substance Use Topics  . Alcohol use: No  . Drug use: No     Allergies   Clindamycin/lincomycin   Review of Systems Review of Systems  Constitutional: Negative for activity change.       All ROS Neg except as noted in HPI  HENT: Positive for ear pain. Negative for nosebleeds.        Ear swelling  Eyes: Negative for photophobia and discharge.  Respiratory: Negative for cough, shortness of breath and wheezing.   Cardiovascular: Negative for chest pain and palpitations.  Gastrointestinal: Negative for abdominal pain and blood in stool.  Genitourinary: Negative for dysuria, frequency and hematuria.  Musculoskeletal: Negative for arthralgias, back pain and neck pain.  Skin: Negative.   Neurological: Negative for dizziness, seizures and speech difficulty.  Psychiatric/Behavioral: Negative for confusion and hallucinations.     Physical Exam Updated Vital Signs BP (!) 163/92 (BP Location: Right Arm)   Pulse 93   Temp 97.8 F (36.6 C) (Oral)   Ht 5\' 9"  (1.753 m)   Wt 116.6 kg   SpO2 99%   BMI 37.95 kg/m   Physical Exam Vitals signs and nursing note reviewed.  Constitutional:      Appearance: He is well-developed. He is not toxic-appearing.  HENT:     Head: Normocephalic.     Right Ear: Tympanic membrane and external ear normal.     Left Ear: Tympanic membrane and external ear normal.     Ears:      Comments: There are no pre-or postauricular nodes of the right ear.  There is increased redness and some swelling of the earlobe.  The opening of the canal seems to be slightly moist, but no actual drainage appreciated on the right.  The external auditory canal is clear.  There are no foreign body, no blood, no fluid.  The tympanic membrane is within  normal limits.  There is no bulging noted. Eyes:     General: Lids are normal.     Pupils: Pupils are equal, round, and reactive to light.  Neck:     Musculoskeletal: Normal range of motion and neck supple.     Vascular: No carotid bruit.     Comments: There is some palpable adenopathy just below the ear.  It is slightly tender palpation.  The adenopathy does not extend beyond the area just under the ear.  Cardiovascular:     Rate and Rhythm: Normal rate and regular rhythm.     Pulses: Normal pulses.     Heart sounds: Normal heart sounds.  Pulmonary:     Effort: No respiratory distress.     Breath sounds: Normal breath sounds.  Abdominal:     General: Bowel sounds are normal.     Palpations: Abdomen is soft.     Tenderness: There is no abdominal tenderness. There is no guarding.  Musculoskeletal: Normal range of motion.  Lymphadenopathy:     Head:     Right side of head: No submandibular adenopathy.     Left side of head: No submandibular adenopathy.     Cervical: Cervical adenopathy present.  Skin:    General: Skin is warm and dry.  Neurological:     Mental Status: He is alert and oriented to person, place, and time.     Cranial Nerves: No cranial nerve deficit.     Sensory: No sensory deficit.  Psychiatric:        Speech: Speech normal.      ED Treatments / Results  Labs (all labs ordered are listed, but only abnormal results are displayed) Labs Reviewed - No data to display  EKG None  Radiology No results found.  Procedures Procedures (including critical care time)  Medications Ordered in ED Medications - No data to display   Initial Impression / Assessment and Plan / ED Course  I have reviewed the triage vital signs and the nursing notes.  Pertinent labs & imaging results that were available during my care of the patient were reviewed by me and considered in my medical decision making (see chart for details).       Final Clinical Impressions(s) / ED  Diagnoses MDM  Vital signs reviewed.  Blood pressure is elevated at 163/92.  I have asked the patient to have this rechecked.  The patient examination reveals swelling of the left earlobe.  There is also 1 of the postauricular nodes swollen.  The examination favors an infection in the earlobe.  Patient placed on doxycycline, as the patient has been diagnosed with methicillin-resistant staph in the past.  I have asked the patient to have the ear rechecked in the next 3 to 5 days.  Patient is in agreement with this plan.    Final diagnoses:  Skin of left earlobe with infection    ED Discharge Orders         Ordered    doxycycline (VIBRAMYCIN) 100 MG capsule  2 times daily     10/26/18 2131    meloxicam (MOBIC) 15 MG tablet  Daily     10/26/18 2131           Lily Kocher, PA-C 10/27/18 1424    Milton Ferguson, MD 10/27/18 1526

## 2018-10-27 DIAGNOSIS — E782 Mixed hyperlipidemia: Secondary | ICD-10-CM | POA: Insufficient documentation

## 2018-10-27 DIAGNOSIS — K1120 Sialoadenitis, unspecified: Secondary | ICD-10-CM | POA: Diagnosis not present

## 2018-10-27 DIAGNOSIS — H60312 Diffuse otitis externa, left ear: Secondary | ICD-10-CM | POA: Diagnosis not present

## 2018-10-27 DIAGNOSIS — E119 Type 2 diabetes mellitus without complications: Secondary | ICD-10-CM | POA: Insufficient documentation

## 2018-10-27 DIAGNOSIS — K219 Gastro-esophageal reflux disease without esophagitis: Secondary | ICD-10-CM | POA: Insufficient documentation

## 2018-10-27 DIAGNOSIS — I10 Essential (primary) hypertension: Secondary | ICD-10-CM | POA: Diagnosis not present

## 2018-10-27 DIAGNOSIS — IMO0002 Reserved for concepts with insufficient information to code with codable children: Secondary | ICD-10-CM | POA: Insufficient documentation

## 2018-10-29 ENCOUNTER — Other Ambulatory Visit: Payer: Self-pay

## 2018-10-29 ENCOUNTER — Encounter (HOSPITAL_COMMUNITY): Payer: Self-pay

## 2018-10-29 ENCOUNTER — Emergency Department (HOSPITAL_COMMUNITY)
Admission: EM | Admit: 2018-10-29 | Discharge: 2018-10-29 | Disposition: A | Discharge status: Home / Self Care | Payer: Medicare Other | Attending: Emergency Medicine | Admitting: Emergency Medicine

## 2018-10-29 DIAGNOSIS — F17210 Nicotine dependence, cigarettes, uncomplicated: Secondary | ICD-10-CM | POA: Diagnosis not present

## 2018-10-29 DIAGNOSIS — I10 Essential (primary) hypertension: Secondary | ICD-10-CM | POA: Insufficient documentation

## 2018-10-29 DIAGNOSIS — E119 Type 2 diabetes mellitus without complications: Secondary | ICD-10-CM | POA: Diagnosis not present

## 2018-10-29 DIAGNOSIS — J449 Chronic obstructive pulmonary disease, unspecified: Secondary | ICD-10-CM | POA: Insufficient documentation

## 2018-10-29 DIAGNOSIS — H6002 Abscess of left external ear: Secondary | ICD-10-CM | POA: Diagnosis not present

## 2018-10-29 DIAGNOSIS — H61892 Other specified disorders of left external ear: Secondary | ICD-10-CM | POA: Diagnosis present

## 2018-10-29 DIAGNOSIS — Z79899 Other long term (current) drug therapy: Secondary | ICD-10-CM | POA: Diagnosis not present

## 2018-10-29 MED ORDER — POVIDONE-IODINE 10 % EX SOLN
CUTANEOUS | Status: AC
  Administered 2018-10-29: 04:00:00
  Filled 2018-10-29: qty 30

## 2018-10-29 MED ORDER — LIDOCAINE-EPINEPHRINE (PF) 2 %-1:200000 IJ SOLN
10.0000 mL | Freq: Once | INTRAMUSCULAR | Status: AC
  Administered 2018-10-29: 10 mL
  Filled 2018-10-29: qty 10

## 2018-10-29 NOTE — ED Notes (Signed)
I&D suture cart /supplies at bedside for provider.

## 2018-10-29 NOTE — Discharge Instructions (Addendum)
Finish the antibiotics you have already been taking.  You can take the ibuprofen with your oxymorphone.  Use heat over the area for comfort.  Recheck if it seems to be getting worse instead of better over the next several days.

## 2018-10-29 NOTE — ED Triage Notes (Signed)
Pt states his left ear has gotten worse, feels like more pressure, taking his antibiotics for same

## 2018-10-29 NOTE — ED Notes (Signed)
Bandage applied to patient's left ear.

## 2018-10-29 NOTE — ED Provider Notes (Signed)
[facility] EMERGENCY DEPARTMENT Provider Note   CSN: [id number] Arrival date & time: 10/29/18  0319  Time seen 3:40 AM   History   Chief Complaint Chief Complaint  Patient presents with  . Abscess    left ear    HPI [name] is a 42 y.o. male.  HPI patient states he has had some swelling that he thought was a cyst in his left earlobe for a while.  He states it was itching about 5 days ago and as he was scratching at it he felt a pop or a burning sensation inside the earlobe.  He was seen in the ED on December 17 and was diagnosed with an infection and started on doxycycline.  He states he saw his primary care doctor the following day and was advised to continue the antibiotics.  He states he is having increasing swelling and a pressure feeling of the ear.  He states he is getting more painful.  He denies any fever.  He is also concerned about a lymph node that is present.  He denies any known trauma to the earlobe and has never had any piercing done there.  PCP Jettie Booze, NP   Past Medical History:  Diagnosis Date  . Chronic pain   . COPD (chronic obstructive pulmonary disease) ([place])   . Current every day smoker   . Diabetes mellitus without complication ([place])   . High blood pressure   . Hypoglycemia   . Opioid dependence [facility])     Patient Active Problem List   Diagnosis Date Noted  . Uncontrolled type 2 diabetes mellitus with complication, without long-term current use of insulin ([place]) 04/27/2017  . Fever, unknown origin 08/09/2016  . Severe sepsis ([place]) 08/09/2016  . Rigors 08/09/2016  . Current every day smoker 08/09/2016  . Hypokalemia 08/09/2016  . COPD (chronic obstructive pulmonary disease) ([place])   . Chronic pain   . High blood pressure   . Opioid dependence ([place])   . Speech abnormality 11/08/2014  . Hypertension 11/08/2014  . Spinal stenosis in cervical region 04/30/2011  . Shoulder pain 04/30/2011  . INGROWN NAIL 12/18/2010  . SHOULDER  PAIN 12/18/2010  . IMPINGEMENT SYNDROME 12/18/2010  . LATERAL EPICONDYLITIS 12/18/2010    Past Surgical History:  Procedure Laterality Date  . BACK SURGERY    . EYE SURGERY          Home Medications    Prior to Admission medications   Medication Sig Start Date End Date Taking? Authorizing Provider  amLODipine (NORVASC) 10 MG tablet Take 10 mg by mouth daily. 07/29/16   [provider]  baclofen (LIORESAL) 10 MG tablet Take 10 mg by mouth 3 (three) times daily. 08/04/16   [provider]  benazepril-hydrochlorthiazide (LOTENSIN HCT) 10-12.5 MG tablet Take 1 tablet by mouth daily. 07/29/16   [provider]  diclofenac (VOLTAREN) 75 MG EC tablet Take 1 tablet by mouth 2 (two) times daily. 07/31/18   [provider]  doxycycline (VIBRAMYCIN) 100 MG capsule Take 1 capsule (100 mg total) by mouth 2 (two) times daily. 10/26/18   Lily Kocher, PA-C  gabapentin (NEURONTIN) 600 MG tablet Take 600 mg by mouth 4 (four) times daily. 08/04/16   [provider]  ibuprofen (ADVIL,MOTRIN) 200 MG tablet Take 800 mg by mouth every 6 (six) hours as needed for moderate pain.    [provider]  LORazepam (ATIVAN) 0.5 MG tablet Take 1 tablet by mouth daily as needed. 04/27/18  [provider]  meloxicam (MOBIC) 15 MG tablet Take 1 tablet (15 mg total) by mouth daily. 10/26/18   Lily Kocher, PA-C  omeprazole (PRILOSEC) 20 MG capsule Take 1 capsule by mouth daily. 07/30/18   [provider]  oxymorphone (OPANA) 5 MG tablet Take 5 mg by mouth 4 (four) times daily.  09/27/14   [provider]  SPIRIVA HANDIHALER 18 MCG inhalation capsule Place 18 mcg into inhaler and inhale daily. 08/04/16   [provider]  varenicline (CHANTIX STARTING MONTH PAK) 0.5 MG X 11 & 1 MG X 42 tablet Take one 0.5 mg tablet by mouth once daily for 3 days, then increase to one 0.5 mg tablet twice daily for 4 days, then increase to one 1 mg tablet  twice daily. 10/25/18   Herminio Commons, MD    Family History Family History  Problem Relation Age of Onset  . Heart Problems Mother   . Heart attack Father   . Diabetes Father   . Asthma Sister     Social History Social History   Tobacco Use  . Smoking status: Current Every Day Smoker    Packs/day: 2.00    Years: 21.00    Pack years: 42.00    Types: Cigarettes  . Smokeless tobacco: Never Used  Substance Use Topics  . Alcohol use: No  . Drug use: No     Allergies   Clindamycin/lincomycin   Review of Systems Review of Systems  All other systems reviewed and are negative.    Physical Exam Updated Vital Signs BP 123/78 (BP Location: Left Arm)   Pulse 75   Temp 98.1 F (36.7 C) (Oral)   Resp 18   Ht 5\' 9"  (1.753 m)   Wt 114.3 kg   SpO2 95%   BMI 37.21 kg/m   Vital signs normal    Physical Exam Vitals signs and nursing note reviewed.  Constitutional:      Appearance: Normal appearance.  HENT:     Head: Normocephalic and atraumatic.     Right Ear: External ear normal.     Ears:     Comments: Patient has redness and swelling of his left lower earlobe and when I look behind that there is moderate swelling and appears to be an abscess.  It is painful to touch.    Nose: Nose normal.  Eyes:     Extraocular Movements: Extraocular movements intact.     Conjunctiva/sclera: Conjunctivae normal.  Neck:     Musculoskeletal: Normal range of motion.  Cardiovascular:     Rate and Rhythm: Normal rate.  Pulmonary:     Effort: Pulmonary effort is normal. No respiratory distress.  Musculoskeletal: Normal range of motion.  Skin:    General: Skin is warm and dry.  Neurological:     General: No focal deficit present.     Mental Status: He is alert and oriented to person, place, and time.  Psychiatric:        Mood and Affect: Mood normal.        Thought Content: Thought content normal.        Judgment: Judgment normal.     Left ear   Behind left  ear    Right ear     ED Treatments / Results  Labs (all labs ordered are listed, but only abnormal results are displayed) Labs Reviewed - No data to display  EKG None  Radiology No results found.  Procedures .Marland KitchenIncision and Drainage Date/Time: 10/29/2018 5:07  AM Performed by: Rolland Porter, MD Authorized by: Rolland Porter, MD   Consent:    Consent obtained:  Verbal   Consent given by:  Patient Location:    Type:  Abscess   Location:  Head   Head location:  L external ear Pre-procedure details:    Skin preparation:  Betadine Anesthesia (see MAR for exact dosages):    Anesthesia method:  Local infiltration   Local anesthetic:  Lidocaine 2% WITH epi Procedure type:    Complexity:  Simple Procedure details:    Incision types:  Stab incision   Incision depth:  Dermal   Scalpel blade:  11   Wound management:  Probed and deloculated   Drainage:  Purulent   Drainage amount:  Moderate   Packing materials:  None Post-procedure details:    Patient tolerance of procedure:  Tolerated well, no immediate complications   (including critical care time)  Medications Ordered in ED Medications  lidocaine-EPINEPHrine (XYLOCAINE W/EPI) 2 %-1:200000 (PF) injection 10 mL (10 mLs Infiltration Given by Other 10/29/18 0417)  povidone-iodine (BETADINE) 10 % external solution (  Given by Other 10/29/18 0425)     Initial Impression / Assessment and Plan / ED Course  I have reviewed the triage vital signs and the nursing notes.  Pertinent labs & imaging results that were available during my care of the patient were reviewed by me and considered in my medical decision making (see chart for details).     On exam patient appears to have a abscess in his left earlobe.  I&D was done with relief of his complaints of ear pressure.  He has already been on doxycycline for 2 days, he is advised to use heat over the area and finish antibiotics.  He should notice an improvement over the next 24  hours.  Patient already has pain medication to take at home.  Final Clinical Impressions(s) / ED Diagnoses   Final diagnoses:  Abscess of left earlobe    ED Discharge Orders    None      Plan discharge  Rolland Porter, MD, Barbette Or, MD 10/29/18 [phone]

## 2018-11-19 ENCOUNTER — Telehealth: Payer: Self-pay | Admitting: Cardiovascular Disease

## 2018-11-19 MED ORDER — VARENICLINE TARTRATE 1 MG PO TABS: 1.0000 mg | ORAL_TABLET | Freq: Two times a day (BID) | ORAL | 0 refills | Status: DC

## 2018-11-19 MED ORDER — VARENICLINE TARTRATE 0.5 MG X 11 & 1 MG X 42 PO MISC: ORAL | 0 refills | Status: DC

## 2018-11-19 NOTE — Telephone Encounter (Signed)
Patient is asking for RX for Chantix be sent to [facility] in [place]. Please call when this is done. / tg

## 2018-11-19 NOTE — Telephone Encounter (Signed)
Will forward to Dr. Koneswaran to advise.  

## 2018-11-19 NOTE — Telephone Encounter (Signed)
DONE

## 2018-11-19 NOTE — Telephone Encounter (Signed)
That would be fine 

## 2018-11-24 DIAGNOSIS — I10 Essential (primary) hypertension: Secondary | ICD-10-CM | POA: Diagnosis not present

## 2018-11-24 DIAGNOSIS — E119 Type 2 diabetes mellitus without complications: Secondary | ICD-10-CM | POA: Diagnosis not present

## 2018-12-28 DIAGNOSIS — D229 Melanocytic nevi, unspecified: Secondary | ICD-10-CM | POA: Diagnosis not present

## 2018-12-28 DIAGNOSIS — I10 Essential (primary) hypertension: Secondary | ICD-10-CM | POA: Diagnosis not present

## 2019-01-04 DIAGNOSIS — R5383 Other fatigue: Secondary | ICD-10-CM | POA: Diagnosis not present

## 2019-01-04 DIAGNOSIS — I10 Essential (primary) hypertension: Secondary | ICD-10-CM | POA: Diagnosis not present

## 2019-01-05 DIAGNOSIS — R5383 Other fatigue: Secondary | ICD-10-CM | POA: Diagnosis not present

## 2019-01-14 ENCOUNTER — Encounter (HOSPITAL_BASED_OUTPATIENT_CLINIC_OR_DEPARTMENT_OTHER): Payer: Medicare Other

## 2019-01-20 DIAGNOSIS — W57XXXA Bitten or stung by nonvenomous insect and other nonvenomous arthropods, initial encounter: Secondary | ICD-10-CM | POA: Diagnosis not present

## 2019-01-20 DIAGNOSIS — S50862A Insect bite (nonvenomous) of left forearm, initial encounter: Secondary | ICD-10-CM | POA: Diagnosis not present

## 2019-01-20 DIAGNOSIS — I10 Essential (primary) hypertension: Secondary | ICD-10-CM | POA: Diagnosis not present

## 2019-02-08 DIAGNOSIS — M5416 Radiculopathy, lumbar region: Secondary | ICD-10-CM | POA: Diagnosis not present

## 2019-02-08 DIAGNOSIS — M545 Low back pain: Secondary | ICD-10-CM | POA: Diagnosis not present

## 2019-04-06 DIAGNOSIS — M5136 Other intervertebral disc degeneration, lumbar region: Secondary | ICD-10-CM | POA: Diagnosis not present

## 2019-04-06 DIAGNOSIS — R030 Elevated blood-pressure reading, without diagnosis of hypertension: Secondary | ICD-10-CM | POA: Diagnosis not present

## 2019-04-19 DIAGNOSIS — L0501 Pilonidal cyst with abscess: Secondary | ICD-10-CM | POA: Diagnosis not present

## 2019-04-21 DIAGNOSIS — I10 Essential (primary) hypertension: Secondary | ICD-10-CM | POA: Diagnosis not present

## 2019-04-21 DIAGNOSIS — E785 Hyperlipidemia, unspecified: Secondary | ICD-10-CM | POA: Diagnosis not present

## 2019-04-21 DIAGNOSIS — E119 Type 2 diabetes mellitus without complications: Secondary | ICD-10-CM | POA: Diagnosis not present

## 2019-04-23 DIAGNOSIS — L0501 Pilonidal cyst with abscess: Secondary | ICD-10-CM | POA: Diagnosis not present

## 2019-04-24 DIAGNOSIS — L0501 Pilonidal cyst with abscess: Secondary | ICD-10-CM | POA: Diagnosis not present

## 2019-04-25 DIAGNOSIS — E119 Type 2 diabetes mellitus without complications: Secondary | ICD-10-CM | POA: Diagnosis not present

## 2019-04-25 DIAGNOSIS — M47812 Spondylosis without myelopathy or radiculopathy, cervical region: Secondary | ICD-10-CM | POA: Diagnosis not present

## 2019-04-25 DIAGNOSIS — E782 Mixed hyperlipidemia: Secondary | ICD-10-CM | POA: Diagnosis not present

## 2019-04-25 DIAGNOSIS — M792 Neuralgia and neuritis, unspecified: Secondary | ICD-10-CM | POA: Diagnosis not present

## 2019-04-25 DIAGNOSIS — I10 Essential (primary) hypertension: Secondary | ICD-10-CM | POA: Diagnosis not present

## 2019-04-29 DIAGNOSIS — Z713 Dietary counseling and surveillance: Secondary | ICD-10-CM | POA: Diagnosis not present

## 2019-04-29 DIAGNOSIS — E119 Type 2 diabetes mellitus without complications: Secondary | ICD-10-CM | POA: Diagnosis not present

## 2019-05-10 DIAGNOSIS — H5213 Myopia, bilateral: Secondary | ICD-10-CM | POA: Diagnosis not present

## 2019-05-10 DIAGNOSIS — H18603 Keratoconus, unspecified, bilateral: Secondary | ICD-10-CM | POA: Diagnosis not present

## 2019-05-10 DIAGNOSIS — E119 Type 2 diabetes mellitus without complications: Secondary | ICD-10-CM | POA: Diagnosis not present

## 2019-05-12 DIAGNOSIS — M5416 Radiculopathy, lumbar region: Secondary | ICD-10-CM | POA: Diagnosis not present

## 2019-05-24 DIAGNOSIS — K649 Unspecified hemorrhoids: Secondary | ICD-10-CM | POA: Diagnosis not present

## 2019-05-24 DIAGNOSIS — I10 Essential (primary) hypertension: Secondary | ICD-10-CM | POA: Diagnosis not present

## 2019-06-07 DIAGNOSIS — M5417 Radiculopathy, lumbosacral region: Secondary | ICD-10-CM | POA: Diagnosis not present

## 2019-06-07 DIAGNOSIS — I10 Essential (primary) hypertension: Secondary | ICD-10-CM | POA: Diagnosis not present

## 2019-06-09 DIAGNOSIS — M5416 Radiculopathy, lumbar region: Secondary | ICD-10-CM | POA: Diagnosis not present

## 2019-06-09 DIAGNOSIS — M5136 Other intervertebral disc degeneration, lumbar region: Secondary | ICD-10-CM | POA: Diagnosis not present

## 2019-06-09 DIAGNOSIS — I10 Essential (primary) hypertension: Secondary | ICD-10-CM | POA: Diagnosis not present

## 2019-06-16 DIAGNOSIS — Z713 Dietary counseling and surveillance: Secondary | ICD-10-CM | POA: Diagnosis not present

## 2019-06-16 DIAGNOSIS — E119 Type 2 diabetes mellitus without complications: Secondary | ICD-10-CM | POA: Diagnosis not present

## 2019-06-27 DIAGNOSIS — I10 Essential (primary) hypertension: Secondary | ICD-10-CM | POA: Diagnosis not present

## 2019-06-27 DIAGNOSIS — M5416 Radiculopathy, lumbar region: Secondary | ICD-10-CM | POA: Diagnosis not present

## 2019-06-28 ENCOUNTER — Other Ambulatory Visit: Payer: Self-pay | Admitting: Neurosurgery

## 2019-08-10 DIAGNOSIS — M5136 Other intervertebral disc degeneration, lumbar region: Secondary | ICD-10-CM | POA: Diagnosis not present

## 2019-08-10 DIAGNOSIS — M5416 Radiculopathy, lumbar region: Secondary | ICD-10-CM | POA: Diagnosis not present

## 2019-09-01 DIAGNOSIS — E782 Mixed hyperlipidemia: Secondary | ICD-10-CM | POA: Diagnosis not present

## 2019-09-01 DIAGNOSIS — Z0000 Encounter for general adult medical examination without abnormal findings: Secondary | ICD-10-CM | POA: Diagnosis not present

## 2019-09-01 DIAGNOSIS — I10 Essential (primary) hypertension: Secondary | ICD-10-CM | POA: Diagnosis not present

## 2019-09-01 DIAGNOSIS — E119 Type 2 diabetes mellitus without complications: Secondary | ICD-10-CM | POA: Diagnosis not present

## 2019-09-02 DIAGNOSIS — F331 Major depressive disorder, recurrent, moderate: Secondary | ICD-10-CM | POA: Insufficient documentation

## 2019-10-27 DIAGNOSIS — L0291 Cutaneous abscess, unspecified: Secondary | ICD-10-CM | POA: Diagnosis not present

## 2019-10-27 DIAGNOSIS — M5416 Radiculopathy, lumbar region: Secondary | ICD-10-CM | POA: Diagnosis not present

## 2019-10-27 DIAGNOSIS — I10 Essential (primary) hypertension: Secondary | ICD-10-CM | POA: Diagnosis not present

## 2019-10-27 DIAGNOSIS — M5136 Other intervertebral disc degeneration, lumbar region: Secondary | ICD-10-CM | POA: Diagnosis not present

## 2019-11-26 DIAGNOSIS — I10 Essential (primary) hypertension: Secondary | ICD-10-CM | POA: Diagnosis not present

## 2019-12-05 ENCOUNTER — Other Ambulatory Visit: Payer: Self-pay | Admitting: Neurosurgery

## 2020-01-05 NOTE — Progress Notes (Signed)
[place], [address] [place] [address] Phone: [phone] Fax: [place], [place] [address] Phone: [phone] Fax: [phone]      Your procedure is scheduled on Tuesday, March 2nd.  Report to [facility] Main Entrance "A" at 5:30 A.M., and check in at the Admitting office.  Call this number if you have problems the morning of surgery:  [phone]  Call [phone] if you have any questions prior to your surgery date Monday-Friday 8am-4pm    Remember:  Do not eat or drink after midnight the night before your surgery     Take these medicines the morning of surgery with A SIP OF WATER   Amlodipine (Norvasc)  Gabapentin (Neurontin)  Lorazepam (Ativan)  Omeprazole (Prilosec)  Oxymorphone (Opana)   Spiriva Inhaler - if needed  7 days prior to surgery STOP taking any Aspirin (unless otherwise instructed by your surgeon), Aleve, Naproxen, Ibuprofen, Motrin, Advil, Goody's, BC's, all herbal medications, fish oil, and all vitamins.   WHAT DO I DO ABOUT MY DIABETES MEDICATION?   Marland Kitchen Do not take oral diabetes medicines (pills) the morning of surgery.  . The day of surgery, do not take other diabetes injectables, including Byetta (exenatide), Bydureon (exenatide ER), Victoza (liraglutide), or Trulicity (dulaglutide).  . If your CBG is greater than 220 mg/dL, you may take  of your sliding scale (correction) dose of insulin.   HOW TO MANAGE YOUR DIABETES BEFORE AND AFTER SURGERY  Why is it important to control my blood sugar before and after surgery? . Improving blood sugar levels before and after surgery helps healing and can limit problems. . A way of improving blood sugar control is eating a healthy diet by: o  Eating less sugar and carbohydrates o  Increasing activity/exercise o  Talking with your doctor about reaching your blood sugar  goals . High blood sugars (greater than 180 mg/dL) can raise your risk of infections and slow your recovery, so you will need to focus on controlling your diabetes during the weeks before surgery. . Make sure that the doctor who takes care of your diabetes knows about your planned surgery including the date and location.  How do I manage my blood sugar before surgery? . Check your blood sugar at least 4 times a day, starting 2 days before surgery, to make sure that the level is not too high or low. . Check your blood sugar the morning of your surgery when you wake up and every 2 hours until you get to the Short Stay unit. o If your blood sugar is less than 70 mg/dL, you will need to treat for low blood sugar: - Do not take insulin. - Treat a low blood sugar (less than 70 mg/dL) with  cup of clear juice (cranberry or apple), 4 glucose tablets, OR glucose gel. - Recheck blood sugar in 15 minutes after treatment (to make sure it is greater than 70 mg/dL). If your blood sugar is not greater than 70 mg/dL on recheck, call [phone] for further instructions. . Report your blood sugar to the short stay nurse when you get to Short Stay.  . If you are admitted to the hospital after surgery: o Your blood sugar will be checked by the staff and you will probably be given insulin after surgery (instead of oral diabetes medicines) to make sure you have good blood sugar  levels. o The goal for blood sugar control after surgery is 80-180 mg/dL.    The Morning of Surgery  Do not wear jewelry.  Do not wear lotions, powders, colognes, or deodorant  Men may shave face and neck.  Do not bring valuables to the hospital.  [facility] is not responsible for any belongings or valuables.  If you are a smoker, DO NOT Smoke 24 hours prior to surgery  If you wear a CPAP at night please bring your mask the morning of surgery   Remember that you must have someone to transport you home after your surgery, and  remain with you for 24 hours if you are discharged the same day.   Please bring cases for contacts, glasses, hearing aids, dentures or bridgework because it cannot be worn into surgery.    Leave your suitcase in the car.  After surgery it may be brought to your room.  For patients admitted to the hospital, discharge time will be determined by your treatment team.  Patients discharged the day of surgery will not be allowed to drive home.    Special instructions:   [place]- Preparing For Surgery  Before surgery, you can play an important role. Because skin is not sterile, your skin needs to be as free of germs as possible. You can reduce the number of germs on your skin by washing with CHG (chlorahexidine gluconate) Soap before surgery.  CHG is an antiseptic cleaner which kills germs and bonds with the skin to continue killing germs even after washing.    Oral Hygiene is also important to reduce your risk of infection.  Remember - BRUSH YOUR TEETH THE MORNING OF SURGERY WITH YOUR REGULAR TOOTHPASTE  Please do not use if you have an allergy to CHG or antibacterial soaps. If your skin becomes reddened/irritated stop using the CHG.  Do not shave (including legs and underarms) for at least 48 hours prior to first CHG shower. It is OK to shave your face.  Please follow these instructions carefully.   1. Shower the NIGHT BEFORE SURGERY and the MORNING OF SURGERY with CHG Soap.   2. If you chose to wash your hair, wash your hair first as usual with your normal shampoo.  3. After you shampoo, rinse your hair and body thoroughly to remove the shampoo.  4. Use CHG as you would any other liquid soap. You can apply CHG directly to the skin and wash gently with a scrungie or a clean washcloth.   5. Apply the CHG Soap to your body ONLY FROM THE NECK DOWN.  Do not use on open wounds or open sores. Avoid contact with your eyes, ears, mouth and genitals (private parts). Wash Face and genitals  (private parts)  with your normal soap.   6. Wash thoroughly, paying special attention to the area where your surgery will be performed.  7. Thoroughly rinse your body with warm water from the neck down.  8. DO NOT shower/wash with your normal soap after using and rinsing off the CHG Soap.  9. Pat yourself dry with a CLEAN TOWEL.  10. Wear CLEAN PAJAMAS to bed the night before surgery, wear comfortable clothes the morning of surgery  11. Place CLEAN SHEETS on your bed the night of your first shower and DO NOT SLEEP WITH PETS.    Day of Surgery:  Please shower the morning of surgery with the CHG soap Do not apply any deodorants/lotions. Please wear clean clothes to the hospital/surgery  center.   Remember to brush your teeth WITH YOUR REGULAR TOOTHPASTE.   Please read over the following fact sheets that you were given.

## 2020-01-06 ENCOUNTER — Encounter (HOSPITAL_COMMUNITY): Payer: Self-pay

## 2020-01-06 ENCOUNTER — Encounter (HOSPITAL_COMMUNITY)
Admission: RE | Admit: 2020-01-06 | Discharge: 2020-01-06 | Disposition: A | Discharge status: Home / Self Care | Payer: Medicare Other | Source: Ambulatory Visit | Attending: Neurosurgery | Admitting: Neurosurgery

## 2020-01-06 ENCOUNTER — Other Ambulatory Visit: Payer: Self-pay

## 2020-01-06 ENCOUNTER — Other Ambulatory Visit (HOSPITAL_COMMUNITY)
Admission: RE | Admit: 2020-01-06 | Discharge: 2020-01-06 | Disposition: A | Discharge status: Home / Self Care | Payer: Medicare Other | Source: Ambulatory Visit | Attending: Neurosurgery | Admitting: Neurosurgery

## 2020-01-06 DIAGNOSIS — I444 Left anterior fascicular block: Secondary | ICD-10-CM | POA: Insufficient documentation

## 2020-01-06 DIAGNOSIS — Z01810 Encounter for preprocedural cardiovascular examination: Secondary | ICD-10-CM | POA: Diagnosis not present

## 2020-01-06 DIAGNOSIS — Z20822 Contact with and (suspected) exposure to covid-19: Secondary | ICD-10-CM | POA: Diagnosis not present

## 2020-01-06 DIAGNOSIS — Z01818 Encounter for other preprocedural examination: Secondary | ICD-10-CM | POA: Diagnosis not present

## 2020-01-06 DIAGNOSIS — R9431 Abnormal electrocardiogram [ECG] [EKG]: Secondary | ICD-10-CM | POA: Insufficient documentation

## 2020-01-06 DIAGNOSIS — Z01812 Encounter for preprocedural laboratory examination: Secondary | ICD-10-CM | POA: Insufficient documentation

## 2020-01-06 DIAGNOSIS — I10 Essential (primary) hypertension: Secondary | ICD-10-CM | POA: Insufficient documentation

## 2020-01-06 HISTORY — DX: Depression, unspecified: F32.A

## 2020-01-06 HISTORY — DX: Unspecified osteoarthritis, unspecified site: M19.90

## 2020-01-06 HISTORY — DX: Sleep apnea, unspecified: G47.30

## 2020-01-06 HISTORY — DX: Anxiety disorder, unspecified: F41.9

## 2020-01-06 LAB — BASIC METABOLIC PANEL
Anion gap: 11 (ref 5–15)
BUN: 7 mg/dL (ref 6–20)
CO2: 26 mmol/L (ref 22–32)
Calcium: 9.5 mg/dL (ref 8.9–10.3)
Chloride: 99 mmol/L (ref 98–111)
Creatinine, Ser: 0.74 mg/dL (ref 0.61–1.24)
GFR calc Af Amer: >60 mL/min (ref >60)
GFR calc non Af Amer: >60 mL/min (ref >60)
Glucose, Bld: 200 mg/dL — ABNORMAL HIGH (ref 70–99)
Potassium: 3.5 mmol/L (ref 3.5–5.1)
Sodium: 136 mmol/L (ref 135–145)

## 2020-01-06 LAB — CBC
HCT: 47.9 % (ref 39.0–52.0)
Hemoglobin: 16.4 g/dL (ref 13.0–17.0)
MCH: 29.8 pg (ref 26.0–34.0)
MCHC: 34.2 g/dL (ref 30.0–36.0)
MCV: 86.9 fL (ref 80.0–100.0)
Platelets: 269 10*3/uL (ref 150–400)
RBC: 5.51 MIL/uL (ref 4.22–5.81)
RDW: 12.5 % (ref 11.5–15.5)
WBC: 9.6 10*3/uL (ref 4.0–10.5)
nRBC: 0 % (ref 0.0–0.2)

## 2020-01-06 LAB — SURGICAL PCR SCREEN
MRSA, PCR: NEGATIVE
Staphylococcus aureus: NEGATIVE

## 2020-01-06 LAB — HEMOGLOBIN A1C
Hgb A1c MFr Bld: 7.6 % — ABNORMAL HIGH (ref 4.8–5.6)
Mean Plasma Glucose: 171.42 mg/dL

## 2020-01-06 LAB — SARS CORONAVIRUS 2 (TAT 6-24 HRS): SARS Coronavirus 2: NEGATIVE

## 2020-01-06 LAB — GLUCOSE, CAPILLARY: Glucose-Capillary: 265 mg/dL — ABNORMAL HIGH (ref 70–99)

## 2020-01-06 NOTE — Progress Notes (Signed)
PCP - DR Dema Severin  [place] [facility] Cardiologist -na      Chest x-ray - 10/17 EKG - today Stress Test -  ECHO - 7/18 Cardiac Cath -na   Sleep Study - yess CPAP - no  Fasting Blood Sugar - 265 Checks Blood Sugar __occ___ times a day       COVID TEST- today  Anesthesia review: htn,sleep apnea  Needs cpap Patient denies shortness of breath, fever, cough and chest pain at PAT appointment   All instructions explained to the patient, with a verbal understanding of the material. Patient agrees to go over the instructions while at home for a better understanding. Patient also instructed to self quarantine after being tested for COVID-19. The opportunity to ask questions was provided.

## 2020-01-09 NOTE — Anesthesia Preprocedure Evaluation (Addendum)
Anesthesia Evaluation  Patient identified by MRN, date of birth, ID band Patient awake    Reviewed: Allergy & Precautions, H&P , NPO status , Patient's Chart, lab work & pertinent test results  Airway Mallampati: II   Neck ROM: full    Dental   Pulmonary sleep apnea , COPD, former smoker,    breath sounds clear to auscultation       Cardiovascular hypertension,  Rhythm:regular     Neuro/Psych PSYCHIATRIC DISORDERS Anxiety Depression    GI/Hepatic (+)     substance abuse  ,   Endo/Other  diabetes, Type 2  Renal/GU      Musculoskeletal  (+) Arthritis , narcotic dependent  Abdominal   Peds  Hematology   Anesthesia Other Findings   Reproductive/Obstetrics                            Anesthesia Physical Anesthesia Plan  ASA: II  Anesthesia Plan: General   Post-op Pain Management:    Induction: Intravenous  PONV Risk Score and Plan: 2 and Ondansetron, Dexamethasone, Midazolam and Treatment may vary due to age or medical condition  Airway Management Planned: Oral ETT  Additional Equipment:   Intra-op Plan:   Post-operative Plan: Extubation in OR  Informed Consent: I have reviewed the patients History and Physical, chart, labs and discussed the procedure including the risks, benefits and alternatives for the proposed anesthesia with the patient or authorized representative who has indicated his/her understanding and acceptance.       Plan Discussed with: CRNA, Anesthesiologist and Surgeon  Anesthesia Plan Comments: (Pt had has had previous cardiac workups for syncope and chest pain. He was last seen by Dr. Bronson Ing 10/25/18. Per note, "Syncope: Event monitoring did not demonstrate any arrhythmias.  No further cardiac testing is indicated at this time.  No symptom recurrence.Marland KitchenMarland KitchenAtypical chest pain: He has had atypical chest discomfort for several years with a previously normal nuclear  stress test in July 2018.  No further cardiac testing is indicated at this time." He was advised to followup PRN.   BP mildly elevated at PAT 159/84. This is followed by PCP Bradly Bienenstock, NP. Per last OV note 10/27/19, suboptimal HTN control was discussed and medications titrated. Also discussed he would be having lumbar surgery on March 2nd.   Preop labs reviewed. DMII with A1c 7.6. Otherwise unremarkable.   EKG 01/06/20: Normal sinus rhythm. Rate 80. RSR' or QR pattern in V1 suggests right ventricular conduction delay. LAFB. LVH. T wave abnormality, consider lateral ischemia. No significant change from 07/02/2018. R-wave progression less prominent.  Event monitor 08/05/18: 14 day event monitor. Data only available from 59% of planned study time Min HR 58, Max HR 141, Avg HR 87 Reported symptoms correlate with normal sinus rhythm No significant arrhythmias    TTE 05/26/17: - Procedure narrative: Transthoracic echocardiography. Image  quality was adequate. Intravenous contrast (Definity) was  administered.  - Left ventricle: The cavity size was normal. Wall thickness was  increased in a pattern of mild LVH. Systolic function was normal.  The estimated ejection fraction was in the range of 60% to 65%.  Wall motion was normal; there were no regional wall motion  abnormalities. Indeterminate diastolic function.   Nuclear stress 05/26/17: Diffuse nonspecific T wave abnormalities seen throughout study. The study is normal. No myocardial ischemia or scar. This is a low risk study. Nuclear stress EF: 66%.)       Anesthesia Quick Evaluation

## 2020-01-09 NOTE — Progress Notes (Signed)
Anesthesia Chart Review:  Pt had has had previous cardiac workups for syncope and chest pain. He was last seen by Dr. Bronson Ing 10/25/18. Per note, "Syncope: Event monitoring did not demonstrate any arrhythmias.  No further cardiac testing is indicated at this time.  No symptom recurrence.Marland KitchenMarland KitchenAtypical chest pain: He has had atypical chest discomfort for several years with a previously normal nuclear stress test in July 2018.  No further cardiac testing is indicated at this time." He was advised to followup PRN.   BP mildly elevated at PAT 159/84. This is followed by PCP Bradly Bienenstock, NP. Per last OV note 10/27/19, suboptimal HTN control was discussed and medications titrated. Also discussed he would be having lumbar surgery on March 2nd.   Preop labs reviewed. DMII with A1c 7.6. Otherwise unremarkable.   EKG 01/06/20: Normal sinus rhythm. Rate 80. RSR' or QR pattern in V1 suggests right ventricular conduction delay. LAFB. LVH. T wave abnormality, consider lateral ischemia. No significant change from 07/02/2018. R-wave progression less prominent.  Event monitor 08/05/18:  14 day event monitor. Data only available from 59% of planned study time  Min HR 58, Max HR 141, Avg HR 87  Reported symptoms correlate with normal sinus rhythm  No significant arrhythmias    TTE 05/26/17: - Procedure narrative: Transthoracic echocardiography. Image  quality was adequate. Intravenous contrast (Definity) was  administered.  - Left ventricle: The cavity size was normal. Wall thickness was  increased in a pattern of mild LVH. Systolic function was normal.  The estimated ejection fraction was in the range of 60% to 65%.  Wall motion was normal; there were no regional wall motion  abnormalities. Indeterminate diastolic function.   Nuclear stress 05/26/17:  Diffuse nonspecific T wave abnormalities seen throughout study.  The study is normal. No myocardial ischemia or scar.  This is a low risk  study.  Nuclear stress EF: 66%.   Wynonia Musty [facility]/Anesthesiology Phone [phone] 01/09/2020 12:04 PM

## 2020-01-09 NOTE — H&P (Signed)
Chief Complaint   Back pain, left leg pain  HPI   HPI: [name] is a 44 y.o. male with longstanding history of chronic lower back and left leg pain. An MRI of his lumbar spine was obtained and revealed a partially calcified left eccentric L5-S1 disc protrusion with foraminal stenosis.  He has failed reasonable conservative treatment including p.o. medications as well as epidural injections.  He presents today for extraforaminal decompression and microdiskectomy.  He is without any concerns.  Patient Active Problem List   Diagnosis Date Noted  . Uncontrolled type 2 diabetes mellitus with complication, without long-term current use of insulin ([place]) 04/27/2017  . Fever, unknown origin 08/09/2016  . Severe sepsis ([place]) 08/09/2016  . Rigors 08/09/2016  . Current every day smoker 08/09/2016  . Hypokalemia 08/09/2016  . COPD (chronic obstructive pulmonary disease) ([place])   . Chronic pain   . High blood pressure   . Opioid dependence ([place])   . Speech abnormality 11/08/2014  . Hypertension 11/08/2014  . Spinal stenosis in cervical region 04/30/2011  . Shoulder pain 04/30/2011  . INGROWN NAIL 12/18/2010  . SHOULDER PAIN 12/18/2010  . IMPINGEMENT SYNDROME 12/18/2010  . LATERAL EPICONDYLITIS 12/18/2010    PMH: Past Medical History:  Diagnosis Date  . Anxiety   . Arthritis   . Chronic pain   . COPD (chronic obstructive pulmonary disease) ([place])   . Current every day smoker   . Depression   . Diabetes mellitus without complication ([place])   . High blood pressure   . Hypoglycemia   . Opioid dependence ([place])   . Sleep apnea    no cpap... but needs one    PSH: Past Surgical History:  Procedure Laterality Date  . BACK SURGERY    . EYE SURGERY      No medications prior to admission.    SH: Social History   Tobacco Use  . Smoking status: Former Smoker    Packs/day: 2.00    Years: 21.00    Pack years: 42.00    Types: Cigarettes    Quit date: 12/31/2019    Years  since quitting: 0.0  . Smokeless tobacco: Never Used  Substance Use Topics  . Alcohol use: No  . Drug use: No    MEDS: Prior to Admission medications   Medication Sig Start Date End Date Taking? Authorizing Provider  amLODipine (NORVASC) 10 MG tablet Take 10 mg by mouth daily. 07/29/16  Yes [provider]  baclofen (LIORESAL) 10 MG tablet Take 10 mg by mouth 3 (three) times daily. 08/04/16  Yes [provider]  benazepril-hydrochlorthiazide (LOTENSIN HCT) 10-12.5 MG tablet Take 1 tablet by mouth daily. 07/29/16  Yes [provider]  Colloidal Oatmeal (ECZEMA MOISTURIZING EX) Apply 1 application topically daily as needed (eczema).   Yes [provider]  diclofenac (VOLTAREN) 75 MG EC tablet Take 1 tablet by mouth 2 (two) times daily. 07/31/18  Yes [provider]  gabapentin (NEURONTIN) 300 MG capsule Take 300 mg by mouth 4 (four) times daily. 12/24/19  Yes [provider]  ibuprofen (ADVIL,MOTRIN) 200 MG tablet Take 600 mg by mouth every 6 (six) hours as needed for moderate pain.    Yes [provider]  JANUVIA 50 MG tablet Take 50 mg by mouth daily. 10/25/19  Yes [provider]  LORazepam (ATIVAN) 0.5 MG tablet Take 1 tablet by mouth daily as needed for anxiety.  04/27/18  Yes [provider]  omeprazole (PRILOSEC) 20 MG  capsule Take 1 capsule by mouth daily. 07/30/18  Yes [provider]  oxymorphone (OPANA) 5 MG tablet Take 5 mg by mouth 4 (four) times daily.  09/27/14  Yes [provider]  PROAIR HFA 108 (90 Base) MCG/ACT inhaler Inhale 2 puffs into the lungs every 4 (four) hours as needed for shortness of breath. 07/25/19  Yes [provider]  SPIRIVA HANDIHALER 18 MCG inhalation capsule Place 18 mcg into inhaler and inhale daily. 08/04/16  Yes [provider]  trolamine salicylate (ASPERCREME) 10 % cream Apply 1 application topically as needed for muscle pain.   Yes [provider]    ALLERGY: Allergies  Allergen Reactions  . Clindamycin/Lincomycin Nausea Only    "feel funny in head"    Social History   Tobacco Use  . Smoking status: Former Smoker    Packs/day: 2.00    Years: 21.00    Pack years: 42.00    Types: Cigarettes    Quit date: 12/31/2019    Years since quitting: 0.0  . Smokeless tobacco: Never Used  Substance Use Topics  . Alcohol use: No     Family History  Problem Relation Age of Onset  . Heart Problems Mother   . Heart attack Father   . Diabetes Father   . Asthma Sister      ROS   ROS  Exam   There were no vitals filed for this visit. General appearance: WDWN, NAD Eyes: No scleral injection Cardiovascular: Regular rate and rhythm without murmurs, rubs, gallops. No edema or variciosities. Distal pulses normal. Pulmonary: Effort normal, non-labored breathing Musculoskeletal:     Muscle tone upper extremities: Normal    Muscle tone lower extremities: Normal    Motor exam: Upper Extremities Deltoid Bicep Tricep Grip  Right 5/5 5/5 5/5 5/5  Left 5/5 5/5 5/5 5/5   Lower Extremity IP Quad PF DF EHL  Right 5/5 5/5 5/5 5/5 5/5  Left 5/5 5/5 5/5 5/5 5/5   Neurological Mental Status:    - Patient is awake, alert, oriented to person, place, month, year, and situation    - Patient is able to give a clear and coherent history.    - No signs of aphasia or neglect Cranial Nerves    - II: Visual Fields are full. PERRL    - III/IV/VI: EOMI without ptosis or diploplia.     - V: Facial sensation is grossly normal    - VII: Facial movement is symmetric.     - VIII: hearing is intact to voice    - X: Uvula elevates symmetrically    - XI: Shoulder shrug is symmetric.    - XII: tongue is midline without atrophy or fasciculations.  Sensory: Sensation grossly intact to LT  Results - Imaging/Labs   No results found for this or any previous visit (from the past 48 hour(s)).  No results found.  IMAGING: MRI of the  lumbar spine dated 09/24/2018 was reviewed.  This again demonstrates partially calcified left eccentric disc protrusion with resultant left-sided foraminal stenosis and possible compression of the exiting left L5 nerve root.  Impression/Plan   44 y.o. male   With chronic low back and left-sided leg pain likely related to partially calcified left eccentric L5-S1 disc protrusion with foraminal stenosis.  He has continued pain despite reasonable nonsurgical treatment.  We will proceed with left-sided L5-S1 extraforaminal decompression and microdiskectomy.  Risks, benefits and alternatives were discussed.  Patient stated understanding and wished to proceed.  Ferne Reus, PA-C [facility] [employer]

## 2020-01-10 ENCOUNTER — Encounter (HOSPITAL_COMMUNITY)
Admission: RE | Disposition: A | Discharge status: Home / Self Care | Payer: Self-pay | Source: Home / Self Care | Attending: Neurosurgery

## 2020-01-10 ENCOUNTER — Ambulatory Visit (HOSPITAL_COMMUNITY): Payer: Medicare Other

## 2020-01-10 ENCOUNTER — Other Ambulatory Visit: Payer: Self-pay

## 2020-01-10 ENCOUNTER — Ambulatory Visit (HOSPITAL_COMMUNITY): Payer: Medicare Other | Admitting: Certified Registered"

## 2020-01-10 ENCOUNTER — Ambulatory Visit (HOSPITAL_COMMUNITY): Payer: Medicare Other | Admitting: Physician Assistant

## 2020-01-10 ENCOUNTER — Inpatient Hospital Stay (HOSPITAL_COMMUNITY)
Admission: RE | Admit: 2020-01-10 | Discharge: 2020-01-11 | DRG: 516 | Disposition: A | Discharge status: Home / Self Care | Payer: Medicare Other | Attending: Neurosurgery | Admitting: Neurosurgery

## 2020-01-10 ENCOUNTER — Encounter (HOSPITAL_COMMUNITY): Payer: Self-pay | Admitting: Neurosurgery

## 2020-01-10 ENCOUNTER — Inpatient Hospital Stay: Admit: 2020-01-10 | Payer: Medicare Other | Admitting: Neurosurgery

## 2020-01-10 DIAGNOSIS — Z79899 Other long term (current) drug therapy: Secondary | ICD-10-CM

## 2020-01-10 DIAGNOSIS — G96198 Other disorders of meninges, not elsewhere classified: Secondary | ICD-10-CM | POA: Diagnosis not present

## 2020-01-10 DIAGNOSIS — Z981 Arthrodesis status: Secondary | ICD-10-CM | POA: Diagnosis not present

## 2020-01-10 DIAGNOSIS — Z881 Allergy status to other antibiotic agents status: Secondary | ICD-10-CM | POA: Diagnosis not present

## 2020-01-10 DIAGNOSIS — J449 Chronic obstructive pulmonary disease, unspecified: Secondary | ICD-10-CM | POA: Diagnosis not present

## 2020-01-10 DIAGNOSIS — G4730 Sleep apnea, unspecified: Secondary | ICD-10-CM | POA: Diagnosis present

## 2020-01-10 DIAGNOSIS — M5127 Other intervertebral disc displacement, lumbosacral region: Secondary | ICD-10-CM | POA: Diagnosis not present

## 2020-01-10 DIAGNOSIS — Z87891 Personal history of nicotine dependence: Secondary | ICD-10-CM | POA: Diagnosis not present

## 2020-01-10 DIAGNOSIS — M4802 Spinal stenosis, cervical region: Secondary | ICD-10-CM | POA: Diagnosis not present

## 2020-01-10 DIAGNOSIS — M48061 Spinal stenosis, lumbar region without neurogenic claudication: Secondary | ICD-10-CM | POA: Diagnosis not present

## 2020-01-10 DIAGNOSIS — M5416 Radiculopathy, lumbar region: Secondary | ICD-10-CM | POA: Diagnosis present

## 2020-01-10 DIAGNOSIS — M5116 Intervertebral disc disorders with radiculopathy, lumbar region: Principal | ICD-10-CM | POA: Diagnosis present

## 2020-01-10 DIAGNOSIS — F1120 Opioid dependence, uncomplicated: Secondary | ICD-10-CM | POA: Diagnosis present

## 2020-01-10 DIAGNOSIS — I10 Essential (primary) hypertension: Secondary | ICD-10-CM | POA: Diagnosis present

## 2020-01-10 DIAGNOSIS — M5117 Intervertebral disc disorders with radiculopathy, lumbosacral region: Secondary | ICD-10-CM | POA: Diagnosis not present

## 2020-01-10 DIAGNOSIS — Z419 Encounter for procedure for purposes other than remedying health state, unspecified: Secondary | ICD-10-CM

## 2020-01-10 HISTORY — PX: LUMBAR LAMINECTOMY/DECOMPRESSION MICRODISCECTOMY: SHX5026

## 2020-01-10 LAB — GLUCOSE, CAPILLARY
Glucose-Capillary: 240 mg/dL — ABNORMAL HIGH (ref 70–99)
Glucose-Capillary: 115 mg/dL — ABNORMAL HIGH (ref 70–99)
Glucose-Capillary: 226 mg/dL — ABNORMAL HIGH (ref 70–99)
Glucose-Capillary: 242 mg/dL — ABNORMAL HIGH (ref 70–99)

## 2020-01-10 SURGERY — LUMBAR LAMINECTOMY/DECOMPRESSION MICRODISCECTOMY 1 LEVEL
Anesthesia: General | Site: Spine Lumbar | Laterality: Left

## 2020-01-10 SURGERY — LUMBAR LAMINECTOMY/DECOMPRESSION MICRODISCECTOMY 1 LEVEL
Anesthesia: General | Laterality: Left

## 2020-01-10 MED ORDER — HYDROMORPHONE HCL 1 MG/ML IJ SOLN
INTRAMUSCULAR | Status: AC
  Filled 2020-01-10: qty 0.5

## 2020-01-10 MED ORDER — ONDANSETRON HCL 4 MG/2ML IJ SOLN
4.0000 mg | Freq: Four times a day (QID) | INTRAMUSCULAR | Status: AC | PRN
  Administered 2020-01-10: 11:00:00 4 mg via INTRAVENOUS

## 2020-01-10 MED ORDER — LACTATED RINGERS IV SOLN: INTRAVENOUS | Status: DC | PRN

## 2020-01-10 MED ORDER — FLEET ENEMA 7-19 GM/118ML RE ENEM: 1.0000 | ENEMA | Freq: Once | RECTAL | Status: DC | PRN

## 2020-01-10 MED ORDER — BENAZEPRIL HCL 5 MG PO TABS
10.0000 mg | ORAL_TABLET | Freq: Every day | ORAL | Status: DC
  Filled 2020-01-10 (×2): qty 2

## 2020-01-10 MED ORDER — INSULIN ASPART 100 UNIT/ML ~~LOC~~ SOLN
0.0000 [IU] | Freq: Three times a day (TID) | SUBCUTANEOUS | Status: DC
  Filled 2020-01-10: qty 0.15

## 2020-01-10 MED ORDER — SENNOSIDES-DOCUSATE SODIUM 8.6-50 MG PO TABS: 1.0000 | ORAL_TABLET | Freq: Every evening | ORAL | Status: DC | PRN

## 2020-01-10 MED ORDER — ONDANSETRON HCL 4 MG/2ML IJ SOLN: 4.0000 mg | Freq: Four times a day (QID) | INTRAMUSCULAR | Status: DC | PRN

## 2020-01-10 MED ORDER — SODIUM CHLORIDE (PF) 0.9 % IJ SOLN
INTRAMUSCULAR | Status: AC
  Filled 2020-01-10: qty 10

## 2020-01-10 MED ORDER — FENTANYL CITRATE (PF) 250 MCG/5ML IJ SOLN
INTRAMUSCULAR | Status: DC | PRN
  Administered 2020-01-10 (×2): 100 ug via INTRAVENOUS

## 2020-01-10 MED ORDER — ACETAMINOPHEN 500 MG PO TABS
1000.0000 mg | ORAL_TABLET | Freq: Four times a day (QID) | ORAL | Status: AC
  Administered 2020-01-10 – 2020-01-11 (×4): 1000 mg via ORAL
  Filled 2020-01-10 (×4): qty 2

## 2020-01-10 MED ORDER — HYDROMORPHONE HCL 1 MG/ML IJ SOLN
INTRAMUSCULAR | Status: AC
  Filled 2020-01-10: qty 1

## 2020-01-10 MED ORDER — OXYCODONE HCL 5 MG/5ML PO SOLN: 5.0000 mg | Freq: Once | ORAL | Status: AC | PRN

## 2020-01-10 MED ORDER — LINAGLIPTIN 5 MG PO TABS
5.0000 mg | ORAL_TABLET | Freq: Every day | ORAL | Status: DC
  Administered 2020-01-10: 14:00:00 5 mg via ORAL
  Filled 2020-01-10 (×2): qty 1

## 2020-01-10 MED ORDER — METHYLPREDNISOLONE ACETATE 80 MG/ML IJ SUSP
INTRAMUSCULAR | Status: DC | PRN
  Administered 2020-01-10: 80 mg

## 2020-01-10 MED ORDER — BUPIVACAINE HCL (PF) 0.5 % IJ SOLN
INTRAMUSCULAR | Status: AC
  Filled 2020-01-10: qty 30

## 2020-01-10 MED ORDER — THROMBIN 5000 UNITS EX SOLR
OROMUCOSAL | Status: DC | PRN
  Administered 2020-01-10: 5 mL via TOPICAL

## 2020-01-10 MED ORDER — FENTANYL CITRATE (PF) 250 MCG/5ML IJ SOLN
INTRAMUSCULAR | Status: AC
  Filled 2020-01-10: qty 5

## 2020-01-10 MED ORDER — THROMBIN 5000 UNITS EX SOLR
CUTANEOUS | Status: AC
  Filled 2020-01-10: qty 5000

## 2020-01-10 MED ORDER — LIDOCAINE 2% (20 MG/ML) 5 ML SYRINGE
INTRAMUSCULAR | Status: DC | PRN
  Administered 2020-01-10: 100 mg via INTRAVENOUS

## 2020-01-10 MED ORDER — BACLOFEN 10 MG PO TABS
10.0000 mg | ORAL_TABLET | Freq: Three times a day (TID) | ORAL | Status: DC
  Administered 2020-01-10 (×2): 10 mg via ORAL
  Filled 2020-01-10 (×2): qty 1

## 2020-01-10 MED ORDER — SODIUM CHLORIDE 0.9% FLUSH
3.0000 mL | Freq: Two times a day (BID) | INTRAVENOUS | Status: DC
  Administered 2020-01-10 (×2): 3 mL via INTRAVENOUS

## 2020-01-10 MED ORDER — ALBUTEROL SULFATE (2.5 MG/3ML) 0.083% IN NEBU: 3.0000 mL | INHALATION_SOLUTION | RESPIRATORY_TRACT | Status: DC | PRN

## 2020-01-10 MED ORDER — PHENYLEPHRINE 40 MCG/ML (10ML) SYRINGE FOR IV PUSH (FOR BLOOD PRESSURE SUPPORT)
PREFILLED_SYRINGE | INTRAVENOUS | Status: DC | PRN
  Administered 2020-01-10 (×5): 80 ug via INTRAVENOUS

## 2020-01-10 MED ORDER — ACETAMINOPHEN 650 MG RE SUPP: 650.0000 mg | RECTAL | Status: DC | PRN

## 2020-01-10 MED ORDER — ROCURONIUM BROMIDE 10 MG/ML (PF) SYRINGE
PREFILLED_SYRINGE | INTRAVENOUS | Status: AC
  Filled 2020-01-10: qty 10

## 2020-01-10 MED ORDER — SODIUM CHLORIDE 0.9 % IV SOLN: 250.0000 mL | INTRAVENOUS | Status: DC

## 2020-01-10 MED ORDER — SENNA 8.6 MG PO TABS: 1.0000 | ORAL_TABLET | Freq: Two times a day (BID) | ORAL | Status: DC

## 2020-01-10 MED ORDER — LORAZEPAM 0.5 MG PO TABS
0.5000 mg | ORAL_TABLET | Freq: Every day | ORAL | Status: DC | PRN
  Administered 2020-01-10: 0.5 mg via ORAL
  Filled 2020-01-10: qty 1

## 2020-01-10 MED ORDER — TIOTROPIUM BROMIDE MONOHYDRATE 18 MCG IN CAPS: 18.0000 ug | ORAL_CAPSULE | Freq: Every day | RESPIRATORY_TRACT | Status: DC

## 2020-01-10 MED ORDER — HYDROMORPHONE HCL 1 MG/ML IJ SOLN
0.2500 mg | INTRAMUSCULAR | Status: DC | PRN
  Administered 2020-01-10 (×4): 0.5 mg via INTRAVENOUS

## 2020-01-10 MED ORDER — MORPHINE SULFATE 15 MG PO TABS: 15.0000 mg | ORAL_TABLET | ORAL | Status: DC | PRN

## 2020-01-10 MED ORDER — PROPOFOL 10 MG/ML IV BOLUS
INTRAVENOUS | Status: DC | PRN
  Administered 2020-01-10: 200 mg via INTRAVENOUS

## 2020-01-10 MED ORDER — PHENOL 1.4 % MT LIQD: 1.0000 | OROMUCOSAL | Status: DC | PRN

## 2020-01-10 MED ORDER — BISACODYL 10 MG RE SUPP: 10.0000 mg | Freq: Every day | RECTAL | Status: DC | PRN

## 2020-01-10 MED ORDER — LIDOCAINE 2% (20 MG/ML) 5 ML SYRINGE
INTRAMUSCULAR | Status: AC
  Filled 2020-01-10: qty 5

## 2020-01-10 MED ORDER — PANTOPRAZOLE SODIUM 40 MG PO TBEC
40.0000 mg | DELAYED_RELEASE_TABLET | Freq: Every day | ORAL | Status: DC
  Administered 2020-01-10: 14:00:00 40 mg via ORAL
  Filled 2020-01-10: qty 1

## 2020-01-10 MED ORDER — 0.9 % SODIUM CHLORIDE (POUR BTL) OPTIME
TOPICAL | Status: DC | PRN
  Administered 2020-01-10: 1000 mL

## 2020-01-10 MED ORDER — HYDROCHLOROTHIAZIDE 12.5 MG PO CAPS
12.5000 mg | ORAL_CAPSULE | Freq: Every day | ORAL | Status: DC
  Administered 2020-01-10: 11:00:00 12.5 mg via ORAL
  Filled 2020-01-10: qty 1

## 2020-01-10 MED ORDER — ONDANSETRON HCL 4 MG/2ML IJ SOLN
INTRAMUSCULAR | Status: DC | PRN
  Administered 2020-01-10: 4 mg via INTRAVENOUS

## 2020-01-10 MED ORDER — ONDANSETRON HCL 4 MG/2ML IJ SOLN
INTRAMUSCULAR | Status: AC
  Filled 2020-01-10: qty 2

## 2020-01-10 MED ORDER — ONDANSETRON HCL 4 MG PO TABS: 4.0000 mg | ORAL_TABLET | Freq: Four times a day (QID) | ORAL | Status: DC | PRN

## 2020-01-10 MED ORDER — HYDROMORPHONE HCL 1 MG/ML IJ SOLN
INTRAMUSCULAR | Status: DC | PRN
  Administered 2020-01-10: .5 mg via INTRAVENOUS

## 2020-01-10 MED ORDER — LIDOCAINE-EPINEPHRINE 1 %-1:100000 IJ SOLN
INTRAMUSCULAR | Status: AC
  Filled 2020-01-10: qty 1

## 2020-01-10 MED ORDER — INSULIN ASPART 100 UNIT/ML ~~LOC~~ SOLN
0.0000 [IU] | Freq: Every day | SUBCUTANEOUS | Status: DC
  Filled 2020-01-10: qty 0.05

## 2020-01-10 MED ORDER — BENAZEPRIL-HYDROCHLOROTHIAZIDE 10-12.5 MG PO TABS: 1.0000 | ORAL_TABLET | Freq: Every day | ORAL | Status: DC

## 2020-01-10 MED ORDER — FENTANYL CITRATE (PF) 100 MCG/2ML IJ SOLN
INTRAMUSCULAR | Status: AC
  Filled 2020-01-10: qty 2

## 2020-01-10 MED ORDER — GABAPENTIN 300 MG PO CAPS
300.0000 mg | ORAL_CAPSULE | Freq: Four times a day (QID) | ORAL | Status: DC
  Administered 2020-01-10 (×3): 300 mg via ORAL
  Filled 2020-01-10 (×3): qty 1

## 2020-01-10 MED ORDER — MIDAZOLAM HCL 2 MG/2ML IJ SOLN
INTRAMUSCULAR | Status: AC
  Filled 2020-01-10: qty 2

## 2020-01-10 MED ORDER — PROPOFOL 10 MG/ML IV BOLUS
INTRAVENOUS | Status: AC
  Filled 2020-01-10: qty 20

## 2020-01-10 MED ORDER — MIDAZOLAM HCL 5 MG/5ML IJ SOLN
INTRAMUSCULAR | Status: DC | PRN
  Administered 2020-01-10: 2 mg via INTRAVENOUS

## 2020-01-10 MED ORDER — OXYCODONE HCL 5 MG PO TABS
5.0000 mg | ORAL_TABLET | Freq: Once | ORAL | Status: AC | PRN
  Administered 2020-01-10: 10:00:00 5 mg via ORAL

## 2020-01-10 MED ORDER — CEFAZOLIN SODIUM-DEXTROSE 2-4 GM/100ML-% IV SOLN
2.0000 g | Freq: Three times a day (TID) | INTRAVENOUS | Status: AC
  Administered 2020-01-10 (×2): 2 g via INTRAVENOUS
  Filled 2020-01-10 (×2): qty 100

## 2020-01-10 MED ORDER — AMLODIPINE BESYLATE 10 MG PO TABS
10.0000 mg | ORAL_TABLET | Freq: Every day | ORAL | Status: DC
  Filled 2020-01-10: qty 1

## 2020-01-10 MED ORDER — ACETAMINOPHEN 325 MG PO TABS: 650.0000 mg | ORAL_TABLET | ORAL | Status: DC | PRN

## 2020-01-10 MED ORDER — METHYLPREDNISOLONE ACETATE 80 MG/ML IJ SUSP
INTRAMUSCULAR | Status: AC
  Filled 2020-01-10: qty 1

## 2020-01-10 MED ORDER — CEFAZOLIN SODIUM-DEXTROSE 2-3 GM-%(50ML) IV SOLR
INTRAVENOUS | Status: DC | PRN
  Administered 2020-01-10: 2 g via INTRAVENOUS

## 2020-01-10 MED ORDER — SODIUM CHLORIDE 0.9% FLUSH: 3.0000 mL | INTRAVENOUS | Status: DC | PRN

## 2020-01-10 MED ORDER — SODIUM CHLORIDE 0.9 % IV SOLN
INTRAVENOUS | Status: DC | PRN
  Administered 2020-01-10: 500 mL

## 2020-01-10 MED ORDER — OXYCODONE HCL 5 MG PO TABS
ORAL_TABLET | ORAL | Status: AC
  Filled 2020-01-10: qty 1

## 2020-01-10 MED ORDER — HYDROMORPHONE HCL 1 MG/ML IJ SOLN: 0.5000 mg | INTRAMUSCULAR | Status: DC | PRN

## 2020-01-10 MED ORDER — HYDROCORTISONE NA SUCCINATE PF 250 MG IJ SOLR
INTRAMUSCULAR | Status: AC
  Filled 2020-01-10: qty 250

## 2020-01-10 MED ORDER — DOCUSATE SODIUM 100 MG PO CAPS: 100.0000 mg | ORAL_CAPSULE | Freq: Two times a day (BID) | ORAL | Status: DC

## 2020-01-10 MED ORDER — MENTHOL 3 MG MT LOZG: 1.0000 | LOZENGE | OROMUCOSAL | Status: DC | PRN

## 2020-01-10 MED ORDER — ROCURONIUM BROMIDE 10 MG/ML (PF) SYRINGE
PREFILLED_SYRINGE | INTRAVENOUS | Status: DC | PRN
  Administered 2020-01-10: 30 mg via INTRAVENOUS
  Administered 2020-01-10: 20 mg via INTRAVENOUS
  Administered 2020-01-10: 50 mg via INTRAVENOUS

## 2020-01-10 MED ORDER — SUGAMMADEX SODIUM 200 MG/2ML IV SOLN
INTRAVENOUS | Status: DC | PRN
  Administered 2020-01-10: 230 mg via INTRAVENOUS

## 2020-01-10 MED ORDER — SODIUM CHLORIDE 0.9 % IV SOLN: INTRAVENOUS | Status: DC

## 2020-01-10 MED ORDER — FENTANYL CITRATE (PF) 100 MCG/2ML IJ SOLN
25.0000 ug | INTRAMUSCULAR | Status: DC | PRN
  Administered 2020-01-10 (×2): 50 ug via INTRAVENOUS

## 2020-01-10 SURGICAL SUPPLY — 61 items
BAG DECANTER FOR FLEXI CONT (MISCELLANEOUS) ×3 IMPLANT
BAND RUBBER #18 3X1/16 STRL (MISCELLANEOUS) ×6 IMPLANT
BENZOIN TINCTURE PRP APPL 2/3 (GAUZE/BANDAGES/DRESSINGS) IMPLANT
BLADE CLIPPER SURG (BLADE) IMPLANT
BLADE SURG 11 STRL SS (BLADE) ×3 IMPLANT
BUR MATCHSTICK NEURO 3.0 LAGG (BURR) ×2 IMPLANT
BUR PRECISION FLUTE 5.0 (BURR) ×2 IMPLANT
CANISTER SUCT 3000ML PPV (MISCELLANEOUS) ×3 IMPLANT
CARTRIDGE OIL MAESTRO DRILL (MISCELLANEOUS) ×1 IMPLANT
CLOSURE WOUND 1/2 X4 (GAUZE/BANDAGES/DRESSINGS)
COVER WAND RF STERILE (DRAPES) ×3 IMPLANT
DECANTER SPIKE VIAL GLASS SM (MISCELLANEOUS) ×3 IMPLANT
DERMABOND ADVANCED (GAUZE/BANDAGES/DRESSINGS) ×2
DERMABOND ADVANCED .7 DNX12 (GAUZE/BANDAGES/DRESSINGS) ×1 IMPLANT
DIFFUSER DRILL AIR PNEUMATIC (MISCELLANEOUS) ×3 IMPLANT
DRAPE LAPAROTOMY 100X72X124 (DRAPES) ×3 IMPLANT
DRAPE MICROSCOPE LEICA (MISCELLANEOUS) ×3 IMPLANT
DRAPE SURG 17X23 STRL (DRAPES) ×3 IMPLANT
DRSG OPSITE POSTOP 3X4 (GAUZE/BANDAGES/DRESSINGS) ×1 IMPLANT
DRSG OPSITE POSTOP 4X6 (GAUZE/BANDAGES/DRESSINGS) ×2 IMPLANT
DURAPREP 26ML APPLICATOR (WOUND CARE) ×3 IMPLANT
ELECT REM PT RETURN 9FT ADLT (ELECTROSURGICAL) ×3
ELECTRODE REM PT RTRN 9FT ADLT (ELECTROSURGICAL) ×1 IMPLANT
GAUZE 4X4 16PLY RFD (DISPOSABLE) IMPLANT
GAUZE SPONGE 4X4 12PLY STRL (GAUZE/BANDAGES/DRESSINGS) IMPLANT
GLOVE BIO SURGEON STRL SZ7.5 (GLOVE) ×2 IMPLANT
GLOVE BIOGEL PI IND STRL 7.0 (GLOVE) IMPLANT
GLOVE BIOGEL PI IND STRL 7.5 (GLOVE) ×2 IMPLANT
GLOVE BIOGEL PI INDICATOR 7.0 (GLOVE) ×4
GLOVE BIOGEL PI INDICATOR 7.5 (GLOVE) ×6
GLOVE ECLIPSE 7.0 STRL STRAW (GLOVE) ×3 IMPLANT
GLOVE EXAM NITRILE XL STR (GLOVE) IMPLANT
GLOVE SURG SS PI 7.0 STRL IVOR (GLOVE) ×6 IMPLANT
GOWN STRL REUS W/ TWL LRG LVL3 (GOWN DISPOSABLE) ×2 IMPLANT
GOWN STRL REUS W/ TWL XL LVL3 (GOWN DISPOSABLE) IMPLANT
GOWN STRL REUS W/TWL 2XL LVL3 (GOWN DISPOSABLE) IMPLANT
GOWN STRL REUS W/TWL LRG LVL3 (GOWN DISPOSABLE) ×6
GOWN STRL REUS W/TWL XL LVL3 (GOWN DISPOSABLE) ×6
HEMOSTAT POWDER KIT SURGIFOAM (HEMOSTASIS) ×3 IMPLANT
KIT BASIN OR (CUSTOM PROCEDURE TRAY) ×3 IMPLANT
KIT TURNOVER KIT B (KITS) ×3 IMPLANT
NDL HYPO 18GX1.5 BLUNT FILL (NEEDLE) IMPLANT
NDL SPNL 18GX3.5 QUINCKE PK (NEEDLE) IMPLANT
NEEDLE HYPO 18GX1.5 BLUNT FILL (NEEDLE) IMPLANT
NEEDLE HYPO 22GX1.5 SAFETY (NEEDLE) ×3 IMPLANT
NEEDLE SPNL 18GX3.5 QUINCKE PK (NEEDLE) ×3 IMPLANT
NS IRRIG 1000ML POUR BTL (IV SOLUTION) ×3 IMPLANT
OIL CARTRIDGE MAESTRO DRILL (MISCELLANEOUS) ×3
PACK LAMINECTOMY NEURO (CUSTOM PROCEDURE TRAY) ×3 IMPLANT
PAD ARMBOARD 7.5X6 YLW CONV (MISCELLANEOUS) ×9 IMPLANT
SPONGE LAP 4X18 RFD (DISPOSABLE) IMPLANT
SPONGE SURGIFOAM ABS GEL SZ50 (HEMOSTASIS) ×1 IMPLANT
STRIP CLOSURE SKIN 1/2X4 (GAUZE/BANDAGES/DRESSINGS) IMPLANT
SUT VIC AB 0 CT1 18XCR BRD8 (SUTURE) ×1 IMPLANT
SUT VIC AB 0 CT1 8-18 (SUTURE) ×3
SUT VIC AB 2-0 CT1 18 (SUTURE) IMPLANT
SUT VICRYL 3-0 RB1 18 ABS (SUTURE) ×6 IMPLANT
SYR 3ML LL SCALE MARK (SYRINGE) IMPLANT
TOWEL GREEN STERILE (TOWEL DISPOSABLE) ×3 IMPLANT
TOWEL GREEN STERILE FF (TOWEL DISPOSABLE) ×3 IMPLANT
WATER STERILE IRR 1000ML POUR (IV SOLUTION) ×3 IMPLANT

## 2020-01-10 NOTE — Anesthesia Procedure Notes (Signed)
Procedure Name: Intubation Date/Time: 01/10/2020 7:43 AM Performed by: Amadeo Garnet, CRNA Pre-anesthesia Checklist: Patient identified, Emergency Drugs available, Patient being monitored and Suction available Patient Re-evaluated:Patient Re-evaluated prior to induction Oxygen Delivery Method: Circle system utilized Preoxygenation: Pre-oxygenation with 100% oxygen Induction Type: IV induction Ventilation: Mask ventilation without difficulty Laryngoscope Size: Mac and 4 Grade View: Grade I Tube type: Oral Tube size: 7.5 mm Number of attempts: 1 Placement Confirmation: ETT inserted through vocal cords under direct vision,  positive ETCO2 and breath sounds checked- equal and bilateral Secured at: 22 cm Tube secured with: Tape Dental Injury: Teeth and Oropharynx as per pre-operative assessment

## 2020-01-10 NOTE — Progress Notes (Signed)
Dr Kathyrn Sheriff arrived at 0725 to see patient. While discussing  Overnight plan patient shares that he will not have anyone at home overnight. Dr Kathyrn Sheriff states they will sort this out later.CRNA present and aware. PACU  notified.

## 2020-01-10 NOTE — Op Note (Signed)
NEUROSURGERY OPERATIVE NOTE   PREOP DIAGNOSIS: Lumbar disc herniation, Left L5-S1  POSTOP DIAGNOSIS: Same  PROCEDURE: 1. Left L5 laminotomy, foraminotomy and microdiscectomy for decompression of nerve root 2. Use of operating microscope  SURGEON: Dr. Consuella Lose, MD  ASSISTANT: Ferne Reus, PA-C  ANESTHESIA: General Endotracheal  EBL: 100cc  SPECIMENS: None  DRAINS: None  COMPLICATIONS: None immediate  CONDITION: Hemodynamically stable to PACU  HISTORY: [name] is a 44 y.o. male with a history of chronic left-sided low back and leg pain.  He has been managed in a pain management clinic.  Work-up included MRI which did demonstrate relatively chronic partially calcified left eccentric disc herniation at L5-S1 with associated lateral recess/foraminal stenosis.  Of note, the patient does have a remote history of left-sided L5-S1 laminotomy, foraminotomy performed nearly 10 years ago.  Patient elected to proceed with surgical decompression.  The risks, benefits, and alternatives to surgery were reviewed in detail with the patient.  After all his questions were answered informed consent was obtained and witnessed.  PROCEDURE IN DETAIL: After informed consent was obtained and witnessed, the patient was brought to the operating room. After induction of general anesthesia, the patient was positioned on the operative table in the prone position with all pressure points meticulously padded.  Previous skin incision was marked out, and the skin of the low back was then prepped and draped in the usual sterile fashion.  After timeout was conducted, spinal needle was introduced to identify the surface projection of the L5-S1 interspace.  Skin incision was then made sharply and Bovie electrocautery was used to dissect the subcutaneous tissue until the lumbodorsal fascia was identified. The fascia was then incised using Bovie electrocautery and the lamina at the L5 level was  identified and dissection was carried out in the subperiosteal plane. Self-retaining retractor was then placed, and intraoperative x-ray was taken to confirm we were at the correct level.  Using a high-speed drill, laminotomy was completed with a partial medial facetectomy. The ligamentum flavum was then identified and removed and the lateral edge of the thecal sac was identified.  I did note a significant amount of epidural fibrosis especially in the ventral space.  I was able to identify the ventral epidural space and dissection was carried both inferiorly and superiorly.  I was then able to identify the exiting left L5 nerve root.  The disc space at L5-S1 was also identified.  High-speed drill was then used to further extend the laminotomy and medial facetectomy in order to fully decompress.  The disc space was then incised, and using primarily rongeurs, posterior osteophytes were removed in order to decompress both the lateral edge of the thecal sac and the traversing left S1 nerve root.  Curved Kerrison punches were then used to complete foraminotomy in order to fully decompress the exiting left L5 nerve root.  Once this was completed, I was able to freely pass a ball-tipped dissector within the ventral epidural space including underneath the left S1 nerve root as well as out the left-sided L5-S1 foramen.  This indicated to me good decompression.  Hemostasis was then secured using a combination of morcellized Gelfoam and thrombin and bipolar electrocautery. The wound is irrigated with copious amounts of antibiotic saline irrigation. The nerve root was then covered with a long-acting steroid solution. Self-retaining retractor was then removed, and the wound is closed in layers using a combination of interrupted 0 Vicryl and 3-0 Vicryl stitches. The skin was closed using standard skin glue.  At the end of the case all sponge, needle, and instrument counts were correct. The patient was then transferred to  the stretcher and taken to the postanesthesia care unit in stable hemodynamic condition.

## 2020-01-10 NOTE — Progress Notes (Signed)
RN spoke with Hodierne about taking additional ativan dose for today to help with muscle spasms. Hodierne approved additional dose and ativan PO is being sent to floor with patient.

## 2020-01-10 NOTE — Anesthesia Postprocedure Evaluation (Signed)
Anesthesia Post Note  Patient: DAMEYON WIXSON  Procedure(s) Performed: LEFT LUMBAR FIVE- SACRAL ONE EXTRAFORAMINAL MICRODISCECTOMY (Left Spine Lumbar)     Patient location during evaluation: PACU Anesthesia Type: General Level of consciousness: awake and alert Pain management: pain level controlled Vital Signs Assessment: post-procedure vital signs reviewed and stable Respiratory status: spontaneous breathing, nonlabored ventilation, respiratory function stable and patient connected to nasal cannula oxygen Cardiovascular status: blood pressure returned to baseline and stable Postop Assessment: no apparent nausea or vomiting Anesthetic complications: no    Last Vitals:  Vitals:   01/10/20 1117 01/10/20 1147  BP: (!) 149/80 128/77  Pulse: 100 84  Resp: 15 20  Temp: 36.7 C 36.7 C  SpO2: 96% 97%    Last Pain:  Vitals:   01/10/20 1147  TempSrc: Oral  PainSc:                  Cesar Chavez S

## 2020-01-10 NOTE — Transfer of Care (Signed)
Immediate Anesthesia Transfer of Care Note  Patient: [name]  Procedure(s) Performed: LEFT LUMBAR FIVE- SACRAL ONE EXTRAFORAMINAL MICRODISCECTOMY (Left Spine Lumbar)  Patient Location: PACU  Anesthesia Type:General  Level of Consciousness: awake, alert  and oriented  Airway & Oxygen Therapy: Patient Spontanous Breathing and Patient connected to face mask oxygen  Post-op Assessment: Report given to RN, Post -op Vital signs reviewed and stable and Patient moving all extremities  Post vital signs: Reviewed and stable  Last Vitals:  Vitals Value Taken Time  BP 166/103 01/10/20 0924  Temp    Pulse 92 01/10/20 0926  Resp 17 01/10/20 0926  SpO2 98 % 01/10/20 0926  Vitals shown include unvalidated device data.  Last Pain:  Vitals:   01/10/20 0634  TempSrc: Oral  PainSc: 7          Complications: No apparent anesthesia complications

## 2020-01-11 LAB — CBC
HCT: 45.9 % (ref 39.0–52.0)
Hemoglobin: 15.5 g/dL (ref 13.0–17.0)
MCH: 29.6 pg (ref 26.0–34.0)
MCHC: 33.8 g/dL (ref 30.0–36.0)
MCV: 87.8 fL (ref 80.0–100.0)
Platelets: 267 10*3/uL (ref 150–400)
RBC: 5.23 MIL/uL (ref 4.22–5.81)
RDW: 12.2 % (ref 11.5–15.5)
WBC: 15.6 10*3/uL — ABNORMAL HIGH (ref 4.0–10.5)
nRBC: 0 % (ref 0.0–0.2)

## 2020-01-11 LAB — BASIC METABOLIC PANEL
Anion gap: 11 (ref 5–15)
BUN: 9 mg/dL (ref 6–20)
CO2: 27 mmol/L (ref 22–32)
Calcium: 9.2 mg/dL (ref 8.9–10.3)
Chloride: 98 mmol/L (ref 98–111)
Creatinine, Ser: 0.73 mg/dL (ref 0.61–1.24)
GFR calc Af Amer: >60 mL/min (ref >60)
GFR calc non Af Amer: >60 mL/min (ref >60)
Glucose, Bld: 157 mg/dL — ABNORMAL HIGH (ref 70–99)
Potassium: 3.6 mmol/L (ref 3.5–5.1)
Sodium: 136 mmol/L (ref 135–145)

## 2020-01-11 LAB — GLUCOSE, CAPILLARY: Glucose-Capillary: 176 mg/dL — ABNORMAL HIGH (ref 70–99)

## 2020-01-11 LAB — APTT: aPTT: 28 seconds (ref 24–36)

## 2020-01-11 LAB — PROTIME-INR
INR: 1 (ref 0.8–1.2)
Prothrombin Time: 12.7 seconds (ref 11.4–15.2)

## 2020-01-11 NOTE — Progress Notes (Signed)
Patient alert and oriented, mae's well, voiding adequate amount of urine, swallowing without difficulty, no c/o pain at time of discharge. Patient discharged home with family. Discharged instructions given to patient. Patient and family stated understanding of instructions given. Patient has an appointment with Dr. Kathyrn Sheriff.

## 2020-01-11 NOTE — Care Management (Signed)
Potential CC44 flag. Managed medicare with Prior Authorization Number: [id number] Disregard Potential CC44.

## 2020-01-11 NOTE — Discharge Summary (Signed)
Physician Discharge Summary  Patient ID: [name] MRN: [id number] DOB/AGE: July 29, 1976 44 y.o.  Admit date: 01/10/2020 Discharge date: 01/11/2020  Admission Diagnoses:  Lumbar radiculopathy  Discharge Diagnoses:  Same Active Problems:   Lumbar radiculopathy   Discharged Condition: Stable  Hospital Course:  [name] is a 44 y.o. male who was admitted for the below procedure. There were no post operative complications. At time of discharge, pain was well controlled, ambulating with Pt/OT, tolerating po, voiding normal. Ready for discharge.  Treatments: Surgery 1. Left L5 laminotomy, foraminotomy and microdiscectomy for decompression of nerve root 2. Use of operating microscope  Discharge Exam: Blood pressure 137/84, pulse 77, temperature 98.1 F (36.7 C), temperature source Oral, resp. rate 16, SpO2 98 %. Awake, alert, oriented Speech fluent, appropriate CN grossly intact 5/5 BUE/BLE Wound c/d/i  Disposition: Discharge disposition: 01-Home or Self Care       Discharge Instructions    Call MD for:  difficulty breathing, headache or visual disturbances   Complete by: As directed    Call MD for:  persistant dizziness or light-headedness   Complete by: As directed    Call MD for:  redness, tenderness, or signs of infection (pain, swelling, redness, odor or green/yellow discharge around incision site)   Complete by: As directed    Call MD for:  severe uncontrolled pain   Complete by: As directed    Call MD for:  temperature >100.4   Complete by: As directed    Diet general   Complete by: As directed    Driving Restrictions   Complete by: As directed    Do not drive until given clearance.   Increase activity slowly   Complete by: As directed    Lifting restrictions   Complete by: As directed    Do not lift anything >10lbs. Avoid bending and twisting in awkward positions. Avoid bending at the back.   May shower / Bathe   Complete by: As directed    In 24 hours. Okay to wash wound with warm soapy water. Avoid scrubbing the wound. Pat dry.   Remove dressing in 24 hours   Complete by: As directed      Allergies as of 01/11/2020      Reactions   Clindamycin/lincomycin Nausea Only   "feel funny in head"      Medication List    TAKE these medications   amLODipine 10 MG tablet Commonly known as: NORVASC Take 10 mg by mouth daily.   baclofen 10 MG tablet Commonly known as: LIORESAL Take 10 mg by mouth 3 (three) times daily.   benazepril-hydrochlorthiazide 10-12.5 MG tablet Commonly known as: LOTENSIN HCT Take 1 tablet by mouth daily.   diclofenac 75 MG EC tablet Commonly known as: VOLTAREN Take 1 tablet by mouth 2 (two) times daily.   ECZEMA MOISTURIZING EX Apply 1 application topically daily as needed (eczema).   gabapentin 300 MG capsule Commonly known as: NEURONTIN Take 300 mg by mouth 4 (four) times daily.   ibuprofen 200 MG tablet Commonly known as: ADVIL Take 600 mg by mouth every 6 (six) hours as needed for moderate pain.   Januvia 50 MG tablet Generic drug: sitaGLIPtin Take 50 mg by mouth daily.   LORazepam 0.5 MG tablet Commonly known as: ATIVAN Take 1 tablet by mouth daily as needed for anxiety.   omeprazole 20 MG capsule Commonly known as: PRILOSEC Take 1 capsule by mouth daily.   oxymorphone 5 MG tablet Commonly known as: OPANA  Take 5 mg by mouth 4 (four) times daily.   ProAir HFA 108 (90 Base) MCG/ACT inhaler Generic drug: albuterol Inhale 2 puffs into the lungs every 4 (four) hours as needed for shortness of breath.   Spiriva HandiHaler 18 MCG inhalation capsule Generic drug: tiotropium Place 18 mcg into inhaler and inhale daily.   trolamine salicylate 10 % cream Commonly known as: ASPERCREME Apply 1 application topically as needed for muscle pain.      Follow-up Information    Consuella Lose, MD. Schedule an appointment as soon as possible for a visit in 3 week(s).   Specialty:  Neurosurgery Contact information: [address] [place] [address] [place] [address] [phone]           Signed: Traci Sermon 01/11/2020, 9:35 AM

## 2020-01-11 NOTE — Progress Notes (Signed)
  NEUROSURGERY PROGRESS NOTE   No issues overnight. cmplains of appropriate back soreness. Pre op sx resolved Ambulating well. Tolerating po. Voiding normal. Eager for d/c  EXAM:  BP 137/84 (BP Location: Left Arm)   Pulse 77   Temp 98.1 F (36.7 C) (Oral)   Resp 16   SpO2 98%   Awake, alert, oriented  Speech fluent, appropriate  CN grossly intact  5/5 BUE/BLE  Incision: c/d/i  PLAN Doing well D/C home

## 2020-01-11 NOTE — Evaluation (Signed)
Physical Therapy Evaluation Patient Details Name: [name] MRN: [id number] DOB: Aug 26, 1976 Today's Date: 01/11/2020   History of Present Illness  Pt is a 44 y/o male now s/p left L5-S1 extraforaminal microdiscectomy. Pt with PMHx including anxiety, COPD, depression, DM, hx of previous lumbar sx   Clinical Impression  Patient evaluated by Physical Therapy with no further acute PT needs identified. All education has been completed and the patient has no further questions. Pt was able to demonstrate transfers and ambulation with gross modified independence to supervision for safety and no AD. Pt was educated on precautions, positioning recommendations, appropriate activity progression, and car transfer. See below for any follow-up Physical Therapy or equipment needs. PT is signing off. Thank you for this referral.     Follow Up Recommendations No PT follow up;Supervision - Intermittent    Equipment Recommendations  None recommended by PT    Recommendations for Other Services       Precautions / Restrictions Precautions Precautions: Fall;Back Precaution Booklet Issued: Yes (comment) Precaution Comments: Reviewed during functional mobility Restrictions Weight Bearing Restrictions: No      Mobility  Bed Mobility Overal bed mobility: Needs Assistance Bed Mobility: Sidelying to Sit;Rolling Rolling: Supervision Sidelying to sit: Supervision       General bed mobility comments: Verbally reviewed log roll. Pt did not demonstrate during session.   Transfers Overall transfer level: Modified independent Equipment used: None             General transfer comment: increased time and effort, no physical assist required  Ambulation/Gait Ambulation/Gait assistance: Modified independent (Device/Increase time) Gait Distance (Feet): 250 Feet Assistive device: None Gait Pattern/deviations: Step-through pattern;Decreased stride length;Trunk flexed Gait velocity: Decreased Gait  velocity interpretation: <1.8 ft/sec, indicate of risk for recurrent falls General Gait Details: No assist required and no overt LOB noted. Pt with generally flexed trunk - he was cued for improved posture however pt was not able or willing to make corrective changes.   Stairs Stairs: Yes Stairs assistance: Supervision;Modified independent (Device/Increase time) Stair Management: One rail Right;Alternating pattern;Forwards Number of Stairs: 10 General stair comments: Initially with min guard assist progressing to supervision for safety. Pt completed a full flight without difficulty.   Wheelchair Mobility    Modified Rankin (Stroke Patients Only)       Balance Overall balance assessment: Mild deficits observed, not formally tested                                           Pertinent Vitals/Pain Pain Assessment: Faces Faces Pain Scale: Hurts even more Pain Location: back, incisional Pain Descriptors / Indicators: Discomfort;Sore Pain Intervention(s): Limited activity within patient's tolerance;Monitored during session;Repositioned    Home Living Family/patient expects to be discharged to:: Private residence Living Arrangements: Alone Available Help at Discharge: Family;Available PRN/intermittently Type of Home: Mobile home Home Access: Stairs to enter   Entrance Stairs-Number of Steps: 5 Home Layout: One level Home Equipment: None      Prior Function Level of Independence: Independent         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, newspaper        Extremity/Trunk Assessment   Upper Extremity Assessment Upper Extremity Assessment: Defer to OT evaluation    Lower Extremity Assessment Lower Extremity Assessment: Generalized weakness(Consistent with pre-op diagnosis)    Cervical / Trunk Assessment Cervical / Trunk Assessment: Other exceptions  Cervical / Trunk Exceptions: s/p lumbar sx  Communication   Communication: No difficulties   Cognition Arousal/Alertness: Awake/alert Behavior During Therapy: Anxious;WFL for tasks assessed/performed Overall Cognitive Status: Within Functional Limits for tasks assessed                                        General Comments      Exercises     Assessment/Plan    PT Assessment Patent does not need any further PT services  PT Problem List         PT Treatment Interventions      PT Goals (Current goals can be found in the Care Plan section)  Acute Rehab PT Goals Patient Stated Goal: wants to be able to get back to work PT Goal Formulation: All assessment and education complete, DC therapy    Frequency     Barriers to discharge        Co-evaluation               AM-PAC PT "6 Clicks" Mobility  Outcome Measure Help needed turning from your back to your side while in a flat bed without using bedrails?: None Help needed moving from lying on your back to sitting on the side of a flat bed without using bedrails?: None Help needed moving to and from a bed to a chair (including a wheelchair)?: None Help needed standing up from a chair using your arms (e.g., wheelchair or bedside chair)?: None Help needed to walk in hospital room?: None Help needed climbing 3-5 steps with a railing? : None 6 Click Score: 24    End of Session   Activity Tolerance: Patient tolerated treatment well Patient left: with call bell/phone within reach(Sitting EOB) Nurse Communication: Mobility status PT Visit Diagnosis: Pain;Difficulty in walking, not elsewhere classified (R26.2) Pain - part of body: (back)    Time: [id number] PT Time Calculation (min) (ACUTE ONLY): 22 min   Charges:   PT Evaluation $PT Eval Low Complexity: [place], PT, DPT Acute Rehabilitation Services Pager: [phone] Office: [phone]   Thelma Comp 01/11/2020, 10:14 AM

## 2020-01-11 NOTE — Evaluation (Signed)
Occupational Therapy Evaluation Patient Details Name: [name] MRN: [id number] DOB: Mar 03, 1976 Today's Date: 01/11/2020    History of Present Illness Pt is a 44 y/o male now s/p left L5-S1 extraforaminal microdiscectomy. Pt with PMHx including anxiety, COPD, depression, DM, hx of previous lumbar sx    Clinical Impression   This 44 y/o male presents with the above. PTA pt living alone, reports independence with ADL and functional mobility. Pt completing room level functional mobility without AD at supervision level this session, he currently is mod independent with UB ADL, able to perform LB ADL at supervision level with use of AE (without AE pt requiring additional assist given current back precautions). Educated pt re: back precautions, AE, safety and compensatory techniques for completing ADL and functional transfers while maintaining precautions. Pt requiring cues for carryover of precautions initially but improvements in carryover noted with education provided and pt verbalizing understanding throughout. Pt reports he will have intermittent assist from family members for some iADL tasks. Questions answered with no further acute OT needs identified at this time. Do not anticipate he will require follow up OT services. Acute OT to sign off, thank you for this referral.     Follow Up Recommendations  No OT follow up;Supervision - Intermittent    Equipment Recommendations  None recommended by OT           Precautions / Restrictions Precautions Precautions: Fall;Back Precaution Booklet Issued: Yes (comment) Precaution Comments: issued and reviewed Restrictions Weight Bearing Restrictions: No      Mobility Bed Mobility Overal bed mobility: Needs Assistance Bed Mobility: Sidelying to Sit;Rolling Rolling: Supervision Sidelying to sit: Supervision       General bed mobility comments: pt proceeded to transition to sitting EOB without use of log roll, requiring increased cues  for proper technique  Transfers Overall transfer level: Modified independent               General transfer comment: increased time and effort, no physical assist required    Balance Overall balance assessment: Mild deficits observed, not formally tested                                         ADL either performed or assessed with clinical judgement   ADL Overall ADL's : Needs assistance/impaired Eating/Feeding: Modified independent;Sitting   Grooming: Supervision/safety;Standing   Upper Body Bathing: Supervision/ safety;Standing;Sitting   Lower Body Bathing: Supervison/ safety;Sit to/from stand;With adaptive equipment   Upper Body Dressing : Modified independent;Sitting   Lower Body Dressing: Supervision/safety;Sit to/from stand Lower Body Dressing Details (indicate cue type and reason): with use of AE, without AE pt requiring increased assist, pt return demonstrating use of sock aide and verbalizing understanding of use of reacher, LH shoe horn Toilet Transfer: Supervision/safety;Ambulation Toilet Transfer Details (indicate cue type and reason): simulated via transfer to/from EOB Toileting- Clothing Manipulation and Hygiene: Supervision/safety;Sit to/from stand       Functional mobility during ADLs: Supervision/safety General ADL Comments: educated pt in use of AE for ADL to increase his safety/independence with tasks while adhering to back precautions                         Pertinent Vitals/Pain Pain Assessment: Faces Faces Pain Scale: Hurts even more Pain Location: back, incisional Pain Descriptors / Indicators: Discomfort;Sore Pain Intervention(s): Limited activity within patient's tolerance;Monitored during session;Repositioned  Hand Dominance     Extremity/Trunk Assessment Upper Extremity Assessment Upper Extremity Assessment: Overall WFL for tasks assessed   Lower Extremity Assessment Lower Extremity Assessment: Defer to  PT evaluation   Cervical / Trunk Assessment Cervical / Trunk Assessment: Other exceptions Cervical / Trunk Exceptions: s/p lumbar sx   Communication Communication Communication: No difficulties   Cognition Arousal/Alertness: Awake/alert Behavior During Therapy: Anxious;WFL for tasks assessed/performed Overall Cognitive Status: Within Functional Limits for tasks assessed                                     General Comments       Exercises     Shoulder Instructions      Home Living Family/patient expects to be discharged to:: Private residence Living Arrangements: Alone Available Help at Discharge: Family;Available PRN/intermittently Type of Home: Mobile home Home Access: Stairs to enter Entrance Stairs-Number of Steps: 5   Home Layout: One level     Bathroom Shower/Tub: Teacher, early years/pre: Handicapped height     Home Equipment: None          Prior Functioning/Environment Level of Independence: Independent        Comments: not currently working        OT Problem List: Decreased strength;Pain;Impaired balance (sitting and/or standing);Decreased knowledge of precautions;Decreased knowledge of use of DME or AE;Decreased activity tolerance;Decreased safety awareness      OT Treatment/Interventions:      OT Goals(Current goals can be found in the care plan section) Acute Rehab OT Goals Patient Stated Goal: wants to be able to get back to work OT Goal Formulation: All assessment and education complete, DC therapy Potential to Achieve Goals: Good  OT Frequency:     Barriers to D/C:            Co-evaluation              AM-PAC OT "6 Clicks" Daily Activity     Outcome Measure Help from another person eating meals?: None Help from another person taking care of personal grooming?: None Help from another person toileting, which includes using toliet, bedpan, or urinal?: None Help from another person bathing (including  washing, rinsing, drying)?: A Little Help from another person to put on and taking off regular upper body clothing?: None Help from another person to put on and taking off regular lower body clothing?: A Little 6 Click Score: 22   End of Session Nurse Communication: Mobility status  Activity Tolerance: Patient tolerated treatment well Patient left: with call bell/phone within reach;Other (comment)(seated EOB, PA in room)  OT Visit Diagnosis: Other abnormalities of gait and mobility (R26.89);Pain Pain - part of body: (back)                Time: [id number] OT Time Calculation (min): 19 min Charges:  OT General Charges $OT Visit: 1 Visit OT Evaluation $OT Eval Low Complexity: [place], OT Acute Rehabilitation Services Pager [phone] Office [phone]   Raymondo Band 01/11/2020, 9:20 AM

## 2020-01-18 DIAGNOSIS — F17210 Nicotine dependence, cigarettes, uncomplicated: Secondary | ICD-10-CM | POA: Insufficient documentation

## 2020-01-18 DIAGNOSIS — E119 Type 2 diabetes mellitus without complications: Secondary | ICD-10-CM | POA: Diagnosis not present

## 2020-01-18 DIAGNOSIS — T7840XA Allergy, unspecified, initial encounter: Secondary | ICD-10-CM | POA: Diagnosis not present

## 2020-01-20 ENCOUNTER — Emergency Department (HOSPITAL_COMMUNITY)
Admission: EM | Admit: 2020-01-20 | Discharge: 2020-01-20 | Disposition: A | Discharge status: Home / Self Care | Payer: Medicare Other | Attending: Emergency Medicine | Admitting: Emergency Medicine

## 2020-01-20 ENCOUNTER — Other Ambulatory Visit: Payer: Self-pay

## 2020-01-20 ENCOUNTER — Encounter (HOSPITAL_COMMUNITY): Payer: Self-pay

## 2020-01-20 DIAGNOSIS — L509 Urticaria, unspecified: Secondary | ICD-10-CM | POA: Diagnosis not present

## 2020-01-20 DIAGNOSIS — Z7984 Long term (current) use of oral hypoglycemic drugs: Secondary | ICD-10-CM | POA: Diagnosis not present

## 2020-01-20 DIAGNOSIS — E119 Type 2 diabetes mellitus without complications: Secondary | ICD-10-CM | POA: Diagnosis not present

## 2020-01-20 DIAGNOSIS — T7840XA Allergy, unspecified, initial encounter: Secondary | ICD-10-CM | POA: Diagnosis not present

## 2020-01-20 DIAGNOSIS — Z79899 Other long term (current) drug therapy: Secondary | ICD-10-CM | POA: Insufficient documentation

## 2020-01-20 DIAGNOSIS — L299 Pruritus, unspecified: Secondary | ICD-10-CM | POA: Diagnosis present

## 2020-01-20 DIAGNOSIS — Z87891 Personal history of nicotine dependence: Secondary | ICD-10-CM | POA: Insufficient documentation

## 2020-01-20 DIAGNOSIS — J449 Chronic obstructive pulmonary disease, unspecified: Secondary | ICD-10-CM | POA: Diagnosis not present

## 2020-01-20 DIAGNOSIS — I10 Essential (primary) hypertension: Secondary | ICD-10-CM | POA: Diagnosis not present

## 2020-01-20 DIAGNOSIS — R52 Pain, unspecified: Secondary | ICD-10-CM | POA: Diagnosis not present

## 2020-01-20 DIAGNOSIS — Z743 Need for continuous supervision: Secondary | ICD-10-CM | POA: Diagnosis not present

## 2020-01-20 MED ORDER — PREDNISONE 20 MG PO TABS: ORAL_TABLET | ORAL | 0 refills | Status: AC

## 2020-01-20 MED ORDER — HYDROXYZINE HCL 25 MG PO TABS
50.0000 mg | ORAL_TABLET | Freq: Once | ORAL | Status: AC
  Administered 2020-01-20: 50 mg via ORAL
  Filled 2020-01-20: qty 2

## 2020-01-20 MED ORDER — EPINEPHRINE 0.3 MG/0.3ML IJ SOAJ
0.3000 mg | Freq: Once | INTRAMUSCULAR | Status: AC
  Administered 2020-01-20: 0.3 mg via INTRAMUSCULAR
  Filled 2020-01-20: qty 0.3

## 2020-01-20 MED ORDER — METHYLPREDNISOLONE SODIUM SUCC 125 MG IJ SOLR
125.0000 mg | Freq: Once | INTRAMUSCULAR | Status: AC
  Administered 2020-01-20: 125 mg via INTRAMUSCULAR
  Filled 2020-01-20: qty 2

## 2020-01-20 MED ORDER — HYDROXYZINE HCL 25 MG PO TABS: 25.0000 mg | ORAL_TABLET | Freq: Four times a day (QID) | ORAL | 0 refills | Status: AC | PRN

## 2020-01-20 NOTE — ED Provider Notes (Signed)
Emergency Department Provider Note   I have reviewed the triage vital signs and the nursing notes.   HISTORY  Chief Complaint No chief complaint on file.   HPI [name] is a 44 y.o. male who presents to the emergency department today secondary to pruritus.  Patient was seen by his primary care office a couple days ago for similar symptoms.  He has diffuse erythematous swollen areas.  No new detergents, contacts, foods, medicines, close or other exposures that he knows of.  No shortness of breath, lightheadedness or near syncopal episodes.  No nausea, vomiting or diarrhea.  Patient states that spots a lot worse around his waistline and his ankles.  Wonders if he could have bedbugs but never had it before.   No other associated or modifying symptoms.    Past Medical History:  Diagnosis Date  . Anxiety   . Arthritis   . Chronic pain   . COPD (chronic obstructive pulmonary disease) ([place])   . Current every day smoker   . Depression   . Diabetes mellitus without complication ([place])   . High blood pressure   . Hypoglycemia   . Opioid dependence ([place])   . Sleep apnea    no cpap... but needs one    Patient Active Problem List   Diagnosis Date Noted  . Lumbar radiculopathy 01/10/2020  . Uncontrolled type 2 diabetes mellitus with complication, without long-term current use of insulin ([place]) 04/27/2017  . Fever, unknown origin 08/09/2016  . Severe sepsis ([place]) 08/09/2016  . Rig[place] 08/09/2016  . Current every day smoker 08/09/2016  . Hypokalemia 08/09/2016  . COPD (chronic obstructive pulmonary disease) ([place])   . Chronic pain   . High blood pressure   . Opioid dependence ([place])   . Speech abnormality 11/08/2014  . Hypertension 11/08/2014  . Spinal stenosis in cervical region 04/30/2011  . Shoulder pain 04/30/2011  . INGROWN NAIL 12/18/2010  . SHOULDER PAIN 12/18/2010  . IMPINGEMENT SYNDROME 12/18/2010  . LATERAL EPICONDYLITIS 12/18/2010    Past Surgical History:    Procedure Laterality Date  . BACK SURGERY    . EYE SURGERY    . LUMBAR LAMINECTOMY/DECOMPRESSION MICRODISCECTOMY Left 01/10/2020   Procedure: LEFT LUMBAR FIVE- SACRAL ONE EXTRAFORAMINAL MICRODISCECTOMY;  Surgeon: Consuella Lose, MD;  Location: [facility];  Service: Neurosurgery;  Laterality: Left;    Current Outpatient Rx  . Order #: [id number] Class: Historical Med  . Order #: [id number] Class: Historical Med  . Order #: [id number] Class: Historical Med  . Order #: [id number] Class: Historical Med  . Order #: [id number] Class: Historical Med  . Order #: [id number] Class: Historical Med  . Order #: [id number] Class: Historical Med  . Order #: [id number] Class: Historical Med  . Order #: [id number] Class: Historical Med  . Order #: [id number] Class: Historical Med  . Order #: [id number] Class: Historical Med  . Order #: [id number] Class: Historical Med  . Order #: [id number] Class: Historical Med  . Order #: [id number] Class: Historical Med  . Order #: [id number] Class: Print  . Order #: [id number] Class: Print    Allergies Clindamycin/lincomycin  Family History  Problem Relation Age of Onset  . Heart Problems Mother   . Heart attack Father   . Diabetes Father   . Asthma Sister     Social History Social History   Tobacco Use  . Smoking status: Former Smoker    Packs/day: 2.00    Years: 21.00    Pack years: 42.00    Types: Cigarettes    Quit date: 12/31/2019  Years since quitting: 0.0  . Smokeless tobacco: Never Used  Substance Use Topics  . Alcohol use: No  . Drug use: No    Review of Systems  All other systems negative except as documented in the HPI. All pertinent positives and negatives as reviewed in the HPI. ____________________________________________   PHYSICAL EXAM:  VITAL SIGNS: ED Triage Vitals  Enc Vitals Group     BP 01/20/20 0103 (!) 155/91     Pulse Rate 01/20/20 0103 91     Resp 01/20/20 0103 16     Temp 01/20/20 0103 98.6 F (37 C)     Temp Source 01/20/20 0103 Oral      SpO2 01/20/20 0103 96 %     Weight 01/20/20 0104 245 lb 9.6 oz (111.4 kg)     Height 01/20/20 0104 5\' 9"  (1.753 m)    Constitutional: Alert and oriented. Well appearing and in no acute distress. Eyes: Conjunctivae are normal. PERRL. EOMI. Head: Atraumatic. Nose: No congestion/rhinnorhea. Mouth/Throat: Mucous membranes are moist.  Oropharynx non-erythematous. Neck: No stridor.  No meningeal signs.   Cardiovascular: Normal rate, regular rhythm. Good peripheral circulation. Grossly normal heart sounds.   Respiratory: Normal respiratory effort.  No retractions. Lungs CTAB. Gastrointestinal: Soft and nontender. No distention.  Musculoskeletal: No lower extremity tenderness nor edema. No gross deformities of extremities. Neurologic:  Normal speech and language. No gross focal neurologic deficits are appreciated.  Skin: Multiple erythematous papular areas on his torso and his arms consistent with what appears to possibly be insect bites however does have what appear to be urticaria around his waistline and inner groin area.  ____________________________________________    INITIAL IMPRESSION / ASSESSMENT AND PLAN / ED COURSE  We will try EpiPen to see if this helps with the urticaria.  Some of these look like they could be insect bites but some of them are not as consistent with that.  Low suspicion for anaphylactic reaction.  No indication for hospitalization.  Will try to help with the symptoms while he is here.  Ultimately symptoms improved with steroids. Will send home on taper along with prn atarax. A&I follow up.     Pertinent labs & imaging results that were available during my care of the patient were reviewed by me and considered in my medical decision making (see chart for details).  A medical screening exam was performed and I feel the patient has had an appropriate workup for their chief complaint at this time and likelihood of emergent condition existing is low. They have been  counseled on decision, discharge, follow up and which symptoms necessitate immediate return to the emergency department. They or their family verbally stated understanding and agreement with plan and discharged in stable condition.   ____________________________________________  FINAL CLINICAL IMPRESSION(S) / ED DIAGNOSES  Final diagnoses:  Urticaria     MEDICATIONS GIVEN DURING THIS VISIT:  Medications  EPINEPHrine (EPI-PEN) injection 0.3 mg (0.3 mg Intramuscular Given 01/20/20 0140)  methylPREDNISolone sodium succinate (SOLU-MEDROL) 125 mg/2 mL injection 125 mg (125 mg Intramuscular Given 01/20/20 0229)  hydrOXYzine (ATARAX/VISTARIL) tablet 50 mg (50 mg Oral Given 01/20/20 0238)     NEW OUTPATIENT MEDICATIONS STARTED DURING THIS VISIT:  Discharge Medication List as of 01/20/2020  3:09 AM    START taking these medications   Details  hydrOXYzine (ATARAX/VISTARIL) 25 MG tablet Take 1 tablet (25 mg total) by mouth every 6 (six) hours as needed for itching., Starting Fri 01/20/2020, Print    predniSONE (DELTASONE) 20 MG  tablet 3 tabs po daily x 3 days, then 2 tabs x 3 days, then 1.5 tabs x 3 days, then 1 tab x 3 days, then 0.5 tabs x 3 days, Print        Note:  This note was prepared with assistance of Dragon voice recognition software. Occasional wrong-word or sound-a-like substitutions may have occurred due to the inherent limitations of voice recognition software.   Merrily Pew, MD 01/20/20 [phone]

## 2020-01-20 NOTE — ED Triage Notes (Signed)
Pt arrived from home via [facility] c/o rash/allergic reaction to unknown source. Pt reports redness, welts and bumps began 2 days ago. Pt took 2 Benadryl caps prior to arrival. Pt denies N/V/D or any change in soaps, detergents, or foods he uses.

## 2020-01-25 DIAGNOSIS — E1165 Type 2 diabetes mellitus with hyperglycemia: Secondary | ICD-10-CM | POA: Diagnosis not present

## 2020-01-26 DIAGNOSIS — E119 Type 2 diabetes mellitus without complications: Secondary | ICD-10-CM | POA: Diagnosis not present

## 2020-01-26 DIAGNOSIS — R55 Syncope and collapse: Secondary | ICD-10-CM | POA: Diagnosis not present

## 2020-02-02 DIAGNOSIS — M5136 Other intervertebral disc degeneration, lumbar region: Secondary | ICD-10-CM | POA: Insufficient documentation

## 2020-02-02 DIAGNOSIS — M5416 Radiculopathy, lumbar region: Secondary | ICD-10-CM | POA: Diagnosis not present

## 2020-02-02 DIAGNOSIS — I10 Essential (primary) hypertension: Secondary | ICD-10-CM | POA: Diagnosis not present

## 2020-05-01 DIAGNOSIS — M542 Cervicalgia: Secondary | ICD-10-CM | POA: Diagnosis not present

## 2020-05-01 DIAGNOSIS — Z6825 Body mass index (BMI) 25.0-25.9, adult: Secondary | ICD-10-CM | POA: Insufficient documentation

## 2020-05-01 DIAGNOSIS — I10 Essential (primary) hypertension: Secondary | ICD-10-CM | POA: Diagnosis not present

## 2020-05-01 DIAGNOSIS — M545 Low back pain: Secondary | ICD-10-CM | POA: Diagnosis not present

## 2020-05-02 DIAGNOSIS — L501 Idiopathic urticaria: Secondary | ICD-10-CM | POA: Insufficient documentation

## 2020-05-03 ENCOUNTER — Ambulatory Visit (INDEPENDENT_AMBULATORY_CARE_PROVIDER_SITE_OTHER): Payer: Medicare Other

## 2020-05-03 ENCOUNTER — Encounter: Payer: Self-pay | Admitting: Emergency Medicine

## 2020-05-03 ENCOUNTER — Other Ambulatory Visit: Payer: Self-pay

## 2020-05-03 ENCOUNTER — Ambulatory Visit
Admission: EM | Admit: 2020-05-03 | Discharge: 2020-05-03 | Disposition: A | Discharge status: Home / Self Care | Payer: Medicare Other | Attending: Emergency Medicine | Admitting: Emergency Medicine

## 2020-05-03 DIAGNOSIS — M79641 Pain in right hand: Secondary | ICD-10-CM

## 2020-05-03 NOTE — ED Triage Notes (Signed)
Slipped and fell and punched the kitchen floor with his RT hand today

## 2020-05-03 NOTE — ED Provider Notes (Addendum)
[facility]   [id number] 05/03/20 Arrival Time: 1916   Chief Complaint  Patient presents with  . Hand Pain     SUBJECTIVE: History from: patient.  [name] is a 44 y.o. male who presented to the urgent care with a complaint of right hand pain for the past few hours.  Reported he fell and punched the kitchen floor.  He localizes the pain to the right hand.  He describes the pain as constant and achy.  He has tried OTC medications without relief.  His symptoms are made worse with ROM.  He denies similar symptoms in the past.  Denies chills, fever, nausea, vomiting, diarrhea.   Review of Systems  Constitutional: Negative.   Respiratory: Negative.   Cardiovascular: Negative.   Musculoskeletal: Positive for arthralgias and joint swelling.  All other systems reviewed and are negative.      Past Medical History:  Diagnosis Date  . Anxiety   . Arthritis   . Chronic pain   . COPD (chronic obstructive pulmonary disease) ([place])   . Current every day smoker   . Depression   . Diabetes mellitus without complication ([place])   . High blood pressure   . Hypoglycemia   . Opioid dependence ([place])   . Sleep apnea    no cpap... but needs one   Past Surgical History:  Procedure Laterality Date  . BACK SURGERY    . EYE SURGERY    . LUMBAR LAMINECTOMY/DECOMPRESSION MICRODISCECTOMY Left 01/10/2020   Procedure: LEFT LUMBAR FIVE- SACRAL ONE EXTRAFORAMINAL MICRODISCECTOMY;  Surgeon: Consuella Lose, MD;  Location: [facility];  Service: Neurosurgery;  Laterality: Left;   Allergies  Allergen Reactions  . Clindamycin/Lincomycin Nausea Only    "feel funny in head"   No current facility-administered medications on file prior to encounter.   Current Outpatient Medications on File Prior to Encounter  Medication Sig Dispense Refill  . amLODipine (NORVASC) 10 MG tablet Take 10 mg by mouth daily.    . baclofen (LIORESAL) 10 MG tablet Take 10 mg by mouth 3 (three) times daily.    .  benazepril-hydrochlorthiazide (LOTENSIN HCT) 10-12.5 MG tablet Take 1 tablet by mouth daily.    . Colloidal Oatmeal (ECZEMA MOISTURIZING EX) Apply 1 application topically daily as needed (eczema).    . diclofenac (VOLTAREN) 75 MG EC tablet Take 1 tablet by mouth 2 (two) times daily.    Marland Kitchen gabapentin (NEURONTIN) 300 MG capsule Take 300 mg by mouth 4 (four) times daily.    . hydrOXYzine (ATARAX/VISTARIL) 25 MG tablet Take 1 tablet (25 mg total) by mouth every 6 (six) hours as needed for itching. 30 tablet 0  . ibuprofen (ADVIL,MOTRIN) 200 MG tablet Take 600 mg by mouth every 6 (six) hours as needed for moderate pain.     Marland Kitchen JANUVIA 50 MG tablet Take 50 mg by mouth daily.    Marland Kitchen LORazepam (ATIVAN) 0.5 MG tablet Take 1 tablet by mouth daily as needed for anxiety.     Marland Kitchen omeprazole (PRILOSEC) 20 MG capsule Take 1 capsule by mouth daily.    Marland Kitchen oxymorphone (OPANA) 5 MG tablet Take 5 mg by mouth 4 (four) times daily.   0  . predniSONE (DELTASONE) 20 MG tablet 3 tabs po daily x 3 days, then 2 tabs x 3 days, then 1.5 tabs x 3 days, then 1 tab x 3 days, then 0.5 tabs x 3 days 27 tablet 0  . PROAIR HFA 108 (90 Base) MCG/ACT inhaler Inhale 2 puffs  into the lungs every 4 (four) hours as needed for shortness of breath.    . SPIRIVA HANDIHALER 18 MCG inhalation capsule Place 18 mcg into inhaler and inhale daily.    Marland Kitchen trolamine salicylate (ASPERCREME) 10 % cream Apply 1 application topically as needed for muscle pain.     Social History   Socioeconomic History  . Marital status: Legally Separated    Spouse name: Not on file  . Number of children: Not on file  . Years of education: Not on file  . Highest education level: Not on file  Occupational History  . Occupation: retired./disabled  Tobacco Use  . Smoking status: Former Smoker    Packs/day: 2.00    Years: 21.00    Pack years: 42.00    Types: Cigarettes    Quit date: 12/31/2019    Years since quitting: 0.3  . Smokeless tobacco: Never Used  Vaping Use    . Vaping Use: Never used  Substance and Sexual Activity  . Alcohol use: No  . Drug use: No  . Sexual activity: Yes  Other Topics Concern  . Not on file  Social History Narrative  . Not on file   Social Determinants of Health   Financial Resource Strain:   . Difficulty of Paying Living Expenses:   Food Insecurity:   . Worried About Charity fundraiser in the Last Year:   . Arboriculturist in the Last Year:   Transportation Needs:   . Film/video editor (Medical):   Marland Kitchen Lack of Transportation (Non-Medical):   Physical Activity:   . Days of Exercise per Week:   . Minutes of Exercise per Session:   Stress:   . Feeling of Stress :   Social Connections:   . Frequency of Communication with Friends and Family:   . Frequency of Social Gatherings with Friends and Family:   . Attends Religious Services:   . Active Member of Clubs or Organizations:   . Attends Archivist Meetings:   Marland Kitchen Marital Status:   Intimate Partner Violence:   . Fear of Current or Ex-Partner:   . Emotionally Abused:   Marland Kitchen Physically Abused:   . Sexually Abused:    Family History  Problem Relation Age of Onset  . Heart Problems Mother   . Heart attack Father   . Diabetes Father   . Asthma Sister     OBJECTIVE:  Vitals:   05/03/20 1923 05/03/20 1933  BP:  (!) 147/90  Pulse:  96  Resp:  18  Temp:  98.3 F (36.8 C)  TempSrc:  Oral  SpO2:  97%  Weight: 247 lb 3.2 oz (112.1 kg)   Height: 5\' 8"  (1.727 m)      Physical Exam Vitals and nursing note reviewed.  Constitutional:      General: He is not in acute distress.    Appearance: Normal appearance. He is normal weight. He is not ill-appearing, toxic-appearing or diaphoretic.  Cardiovascular:     Rate and Rhythm: Normal rate and regular rhythm.     Pulses: Normal pulses.     Heart sounds: Normal heart sounds. No murmur heard.  No friction rub. No gallop.   Pulmonary:     Effort: Pulmonary effort is normal. No respiratory distress.      Breath sounds: Normal breath sounds. No stridor. No wheezing, rhonchi or rales.  Chest:     Chest wall: No tenderness.  Musculoskeletal:  General: Tenderness present.     Right hand: Swelling and tenderness present.     Left hand: Normal.     Comments: The right hand is with obvious deformity compared to the left hand.  Swelling present.  No erythema, atrophy, surface trauma, open wound, nail avulsion, tissue avulsion, partial complete amputation, subungual hematoma, bony deformity present.  Normal cascade of fingers.  Limited range of motion due to pain and swelling.  Neurovascular status are intact.  Neurological:     Mental Status: He is alert.    LABS:  No results found for this or any previous visit (from the past 24 hour(s)).   ASSESSMENT & PLAN:  1. Right hand pain     No orders of the defined types were placed in this encounter.  Patient is stable at discharge.  Right hand X-ray is negative for bony abnormality including fracture.  I have reviewed the x-ray myself and the radiologist interpretation.  I am in agreement with the radiologist interpretation.  Discharge Instructions  Continue to take your pain medication as prescribed for pain Follow RICE instructions as attached Follow-up with PCP Use a wrist splint/may get at [facility] or go to ED for worsening of symptoms  Reviewed expectations re: course of current medical issues. Questions answered. Outlined signs and symptoms indicating need for more acute intervention. Patient verbalized understanding. After Visit Summary given.      Note: This document was prepared using Dragon voice recognition software and may include unintentional dictation errors.    Emerson Monte, FNP 05/03/20 1945    Emerson Monte, FNP 05/03/20 1952

## 2020-05-03 NOTE — Discharge Instructions (Addendum)
Continue to take your pain medication as prescribed for pain Follow RICE instructions as attached Follow-up with PCP Use a wrist splint/may get at [facility] or go to ED for worsening of symptoms

## 2020-05-07 DIAGNOSIS — M5442 Lumbago with sciatica, left side: Secondary | ICD-10-CM | POA: Diagnosis not present

## 2020-05-07 DIAGNOSIS — I10 Essential (primary) hypertension: Secondary | ICD-10-CM | POA: Diagnosis not present

## 2020-05-09 DIAGNOSIS — M545 Low back pain: Secondary | ICD-10-CM | POA: Diagnosis not present

## 2020-05-11 ENCOUNTER — Other Ambulatory Visit: Payer: Self-pay | Admitting: Physician Assistant

## 2020-05-11 DIAGNOSIS — M545 Low back pain, unspecified: Secondary | ICD-10-CM

## 2020-05-28 ENCOUNTER — Other Ambulatory Visit: Payer: Self-pay

## 2020-05-28 ENCOUNTER — Ambulatory Visit
Admission: RE | Admit: 2020-05-28 | Discharge: 2020-05-28 | Disposition: A | Discharge status: Home / Self Care | Payer: Medicare Other | Source: Ambulatory Visit | Attending: Physician Assistant | Admitting: Physician Assistant

## 2020-05-28 DIAGNOSIS — M48061 Spinal stenosis, lumbar region without neurogenic claudication: Secondary | ICD-10-CM | POA: Diagnosis not present

## 2020-05-28 DIAGNOSIS — M545 Low back pain, unspecified: Secondary | ICD-10-CM

## 2020-06-06 ENCOUNTER — Other Ambulatory Visit: Payer: Self-pay | Admitting: Physician Assistant

## 2020-06-06 DIAGNOSIS — M542 Cervicalgia: Secondary | ICD-10-CM | POA: Diagnosis not present

## 2020-06-06 DIAGNOSIS — I10 Essential (primary) hypertension: Secondary | ICD-10-CM | POA: Diagnosis not present

## 2020-06-06 DIAGNOSIS — M545 Low back pain: Secondary | ICD-10-CM | POA: Diagnosis not present

## 2020-07-12 ENCOUNTER — Other Ambulatory Visit: Payer: Medicare Other

## 2020-07-26 DIAGNOSIS — M47812 Spondylosis without myelopathy or radiculopathy, cervical region: Secondary | ICD-10-CM | POA: Diagnosis not present

## 2020-07-26 DIAGNOSIS — M792 Neuralgia and neuritis, unspecified: Secondary | ICD-10-CM | POA: Diagnosis not present

## 2020-07-26 DIAGNOSIS — I10 Essential (primary) hypertension: Secondary | ICD-10-CM | POA: Diagnosis not present

## 2020-08-06 DIAGNOSIS — M25871 Other specified joint disorders, right ankle and foot: Secondary | ICD-10-CM | POA: Diagnosis not present

## 2020-08-06 DIAGNOSIS — S93421S Sprain of deltoid ligament of right ankle, sequela: Secondary | ICD-10-CM | POA: Diagnosis not present

## 2020-08-06 DIAGNOSIS — I10 Essential (primary) hypertension: Secondary | ICD-10-CM | POA: Diagnosis not present

## 2020-08-06 DIAGNOSIS — R52 Pain, unspecified: Secondary | ICD-10-CM | POA: Diagnosis not present

## 2020-08-07 DIAGNOSIS — M545 Low back pain: Secondary | ICD-10-CM | POA: Diagnosis not present

## 2020-08-07 DIAGNOSIS — M542 Cervicalgia: Secondary | ICD-10-CM | POA: Diagnosis not present

## 2020-08-08 ENCOUNTER — Ambulatory Visit
Admission: RE | Admit: 2020-08-08 | Discharge: 2020-08-08 | Disposition: A | Discharge status: Home / Self Care | Payer: Medicare Other | Source: Ambulatory Visit | Attending: Physician Assistant | Admitting: Physician Assistant

## 2020-08-08 DIAGNOSIS — M542 Cervicalgia: Secondary | ICD-10-CM

## 2020-08-08 DIAGNOSIS — M4802 Spinal stenosis, cervical region: Secondary | ICD-10-CM | POA: Diagnosis not present

## 2021-04-25 ENCOUNTER — Other Ambulatory Visit: Payer: Self-pay | Admitting: Orthopedic Surgery

## 2021-04-25 ENCOUNTER — Other Ambulatory Visit (HOSPITAL_COMMUNITY): Payer: Self-pay | Admitting: Orthopedic Surgery

## 2021-04-25 DIAGNOSIS — M25511 Pain in right shoulder: Secondary | ICD-10-CM

## 2021-05-16 ENCOUNTER — Ambulatory Visit (HOSPITAL_COMMUNITY)
Admission: RE | Admit: 2021-05-16 | Discharge: 2021-05-16 | Disposition: A | Discharge status: Home / Self Care | Payer: Medicare Other | Source: Ambulatory Visit | Attending: Orthopedic Surgery | Admitting: Orthopedic Surgery

## 2021-05-16 DIAGNOSIS — M25511 Pain in right shoulder: Secondary | ICD-10-CM

## 2021-06-05 DIAGNOSIS — G540 Brachial plexus disorders: Secondary | ICD-10-CM | POA: Insufficient documentation

## 2021-06-13 DIAGNOSIS — H90A32 Mixed conductive and sensorineural hearing loss, unilateral, left ear with restricted hearing on the contralateral side: Secondary | ICD-10-CM | POA: Insufficient documentation

## 2021-06-13 DIAGNOSIS — H9313 Tinnitus, bilateral: Secondary | ICD-10-CM | POA: Insufficient documentation

## 2021-06-13 DIAGNOSIS — J342 Deviated nasal septum: Secondary | ICD-10-CM | POA: Insufficient documentation

## 2021-06-25 IMAGING — CR DG LUMBAR SPINE 2-3V
2 series · 2 of 2 positions shown · non-contrast
Comparison: MRI lumbar spine dated September 24, 2018.

CLINICAL DATA: Lumbar localization images.

EXAM:
LUMBAR SPINE - 2-3 VIEW

[lateral (1 of 2)]
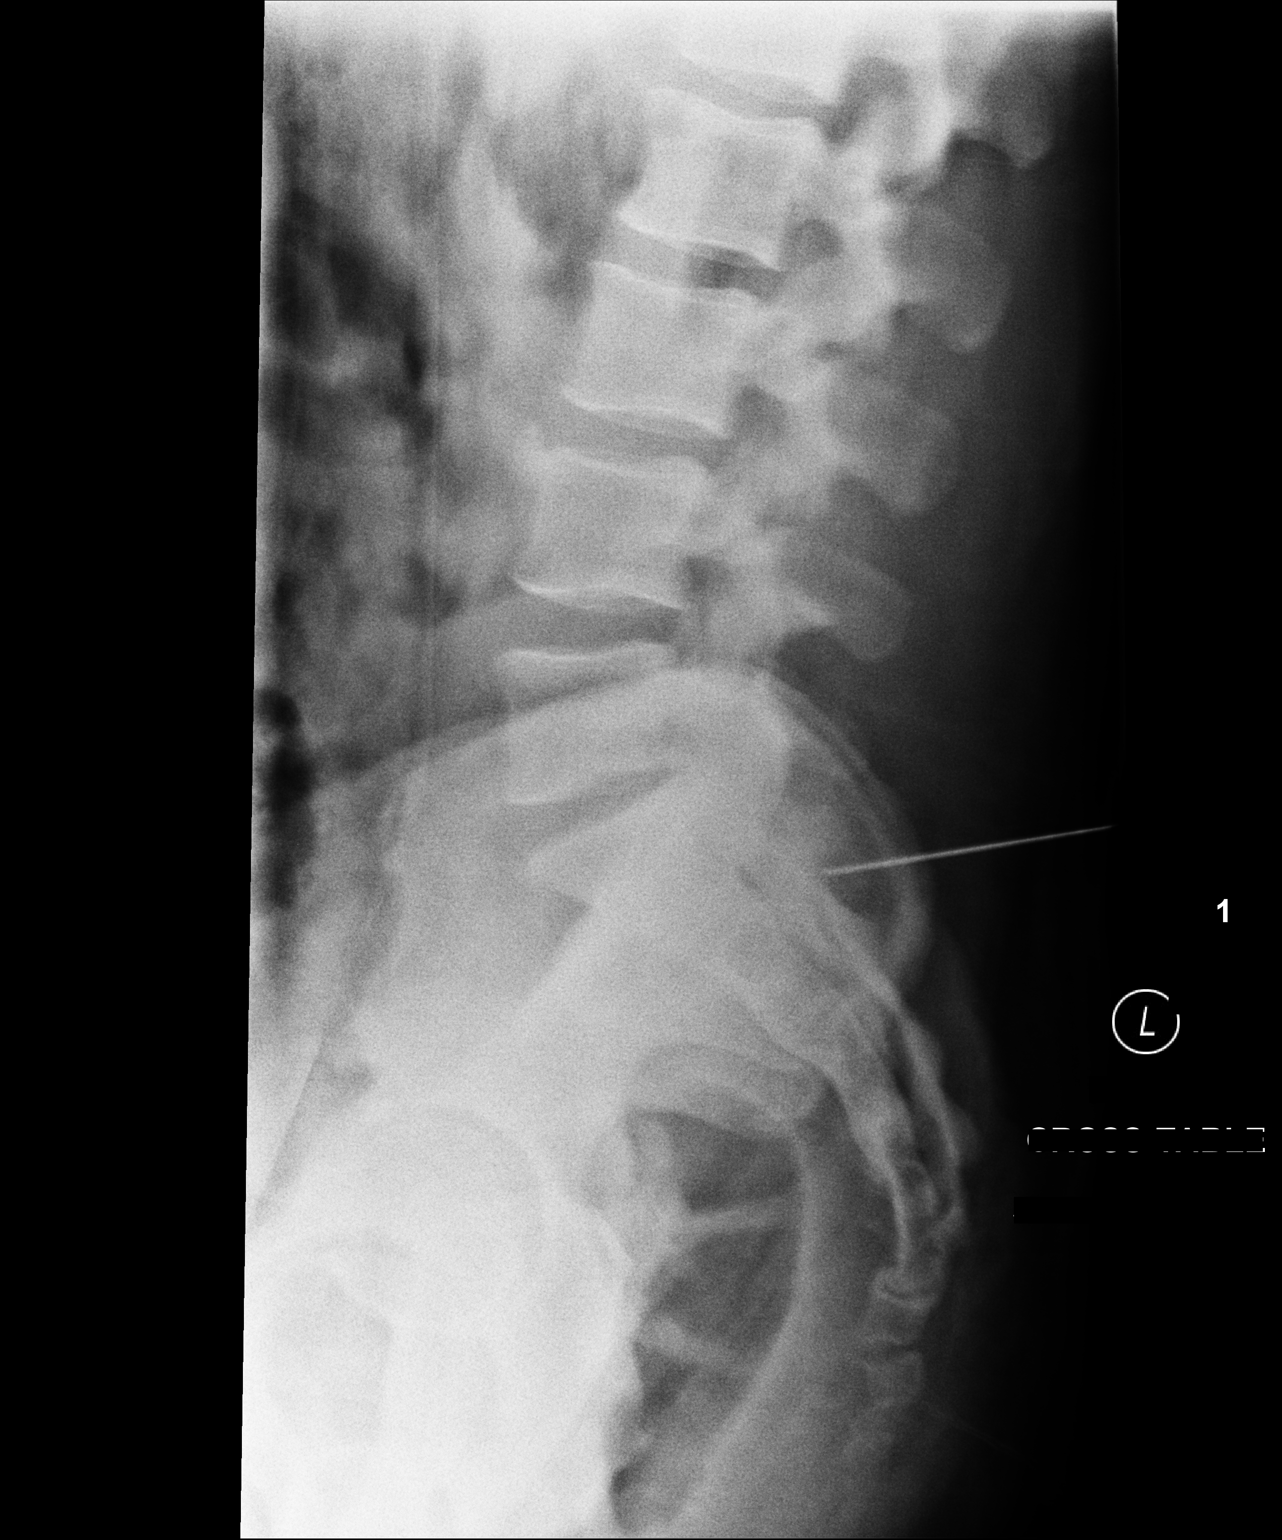

[lateral (2 of 2)]
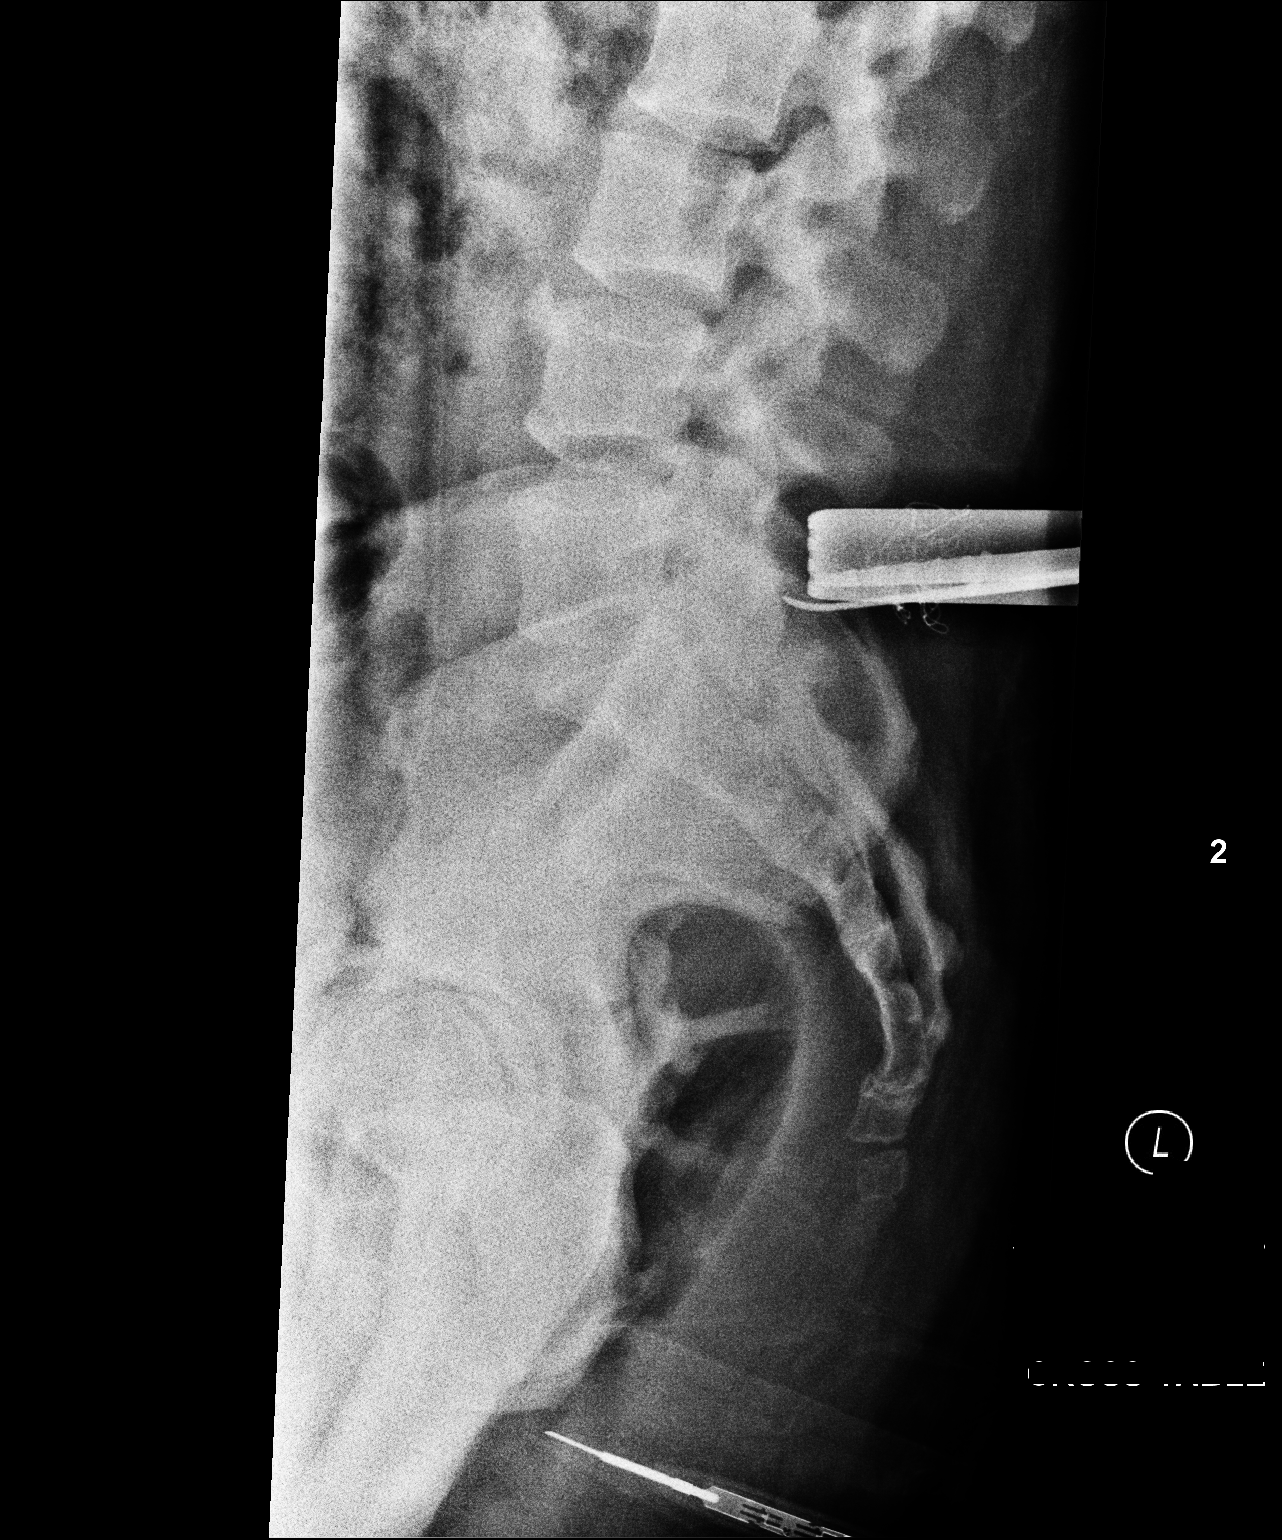

[2 of 2 positions shown; findings below may reference images not displayed]

FINDINGS: Film #1 demonstrates needle localizer overlying the S1-S2 level
posteriorly.

Film #2 demonstrates surgical instrumentation overlying the L5-S1
level posteriorly.

No acute osseous abnormality.
IMPRESSION: Intraoperative lumbar localization images as above.

## 2021-07-11 DIAGNOSIS — G540 Brachial plexus disorders: Secondary | ICD-10-CM | POA: Insufficient documentation

## 2021-07-11 HISTORY — DX: Brachial plexus disorders: G54.0

## 2021-07-15 ENCOUNTER — Emergency Department (HOSPITAL_COMMUNITY)
Admission: EM | Admit: 2021-07-15 | Discharge: 2021-07-16 | Disposition: A | Discharge status: Home / Self Care | Payer: Medicare Other | Attending: Emergency Medicine | Admitting: Emergency Medicine

## 2021-07-15 ENCOUNTER — Emergency Department (HOSPITAL_COMMUNITY): Payer: Medicare Other

## 2021-07-15 ENCOUNTER — Encounter (HOSPITAL_COMMUNITY): Payer: Self-pay | Admitting: Emergency Medicine

## 2021-07-15 ENCOUNTER — Other Ambulatory Visit: Payer: Self-pay

## 2021-07-15 ENCOUNTER — Encounter: Payer: Self-pay | Admitting: Emergency Medicine

## 2021-07-15 DIAGNOSIS — E119 Type 2 diabetes mellitus without complications: Secondary | ICD-10-CM | POA: Diagnosis not present

## 2021-07-15 DIAGNOSIS — Z87891 Personal history of nicotine dependence: Secondary | ICD-10-CM | POA: Insufficient documentation

## 2021-07-15 DIAGNOSIS — J449 Chronic obstructive pulmonary disease, unspecified: Secondary | ICD-10-CM | POA: Insufficient documentation

## 2021-07-15 DIAGNOSIS — R0602 Shortness of breath: Secondary | ICD-10-CM

## 2021-07-15 NOTE — ED Triage Notes (Signed)
Pt here by [id number], reports he had neck surgery at [facility] ([place] on Thursday and has had SOB since; reports worsening pain to left lung area when lying on left side; reports inability to lay on his back at all due to severe SOB

## 2021-07-15 NOTE — ED Notes (Signed)
Pt placed on cardiac monitor with BP to set cycle every 30 minutes. Continuous pulse oximeter applied.  

## 2021-07-15 NOTE — ED Notes (Signed)
Room Air O2 sats 95%.

## 2021-07-16 LAB — BASIC METABOLIC PANEL
Anion gap: 8 (ref 5–15)
BUN: 11 mg/dL (ref 6–20)
CO2: 24 mmol/L (ref 22–32)
Calcium: 9 mg/dL (ref 8.9–10.3)
Chloride: 99 mmol/L (ref 98–111)
Creatinine, Ser: 0.74 mg/dL (ref 0.61–1.24)
GFR, Estimated: >60 mL/min (ref >60)
Glucose, Bld: 389 mg/dL — ABNORMAL HIGH (ref 70–99)
Potassium: 3.6 mmol/L (ref 3.5–5.1)
Sodium: 131 mmol/L — ABNORMAL LOW (ref 135–145)

## 2021-07-16 LAB — CBC
HCT: 46.1 % (ref 39.0–52.0)
Hemoglobin: 16.2 g/dL (ref 13.0–17.0)
MCH: 30.2 pg (ref 26.0–34.0)
MCHC: 35.1 g/dL (ref 30.0–36.0)
MCV: 86 fL (ref 80.0–100.0)
Platelets: 306 10*3/uL (ref 150–400)
RBC: 5.36 MIL/uL (ref 4.22–5.81)
RDW: 12.2 % (ref 11.5–15.5)
WBC: 9.2 10*3/uL (ref 4.0–10.5)
nRBC: 0 % (ref 0.0–0.2)

## 2021-07-16 LAB — PROTIME-INR
INR: 0.9 (ref 0.8–1.2)
Prothrombin Time: 12 seconds (ref 11.4–15.2)

## 2021-07-16 LAB — TROPONIN I (HIGH SENSITIVITY): Troponin I (High Sensitivity): 3 ng/L (ref <18)

## 2021-07-16 LAB — BRAIN NATRIURETIC PEPTIDE: B Natriuretic Peptide: 39 pg/mL (ref 0.0–100.0)

## 2021-07-16 MED ORDER — IOHEXOL 350 MG/ML SOLN: 100.0000 mL | Freq: Once | INTRAVENOUS | Status: DC | PRN

## 2021-07-16 NOTE — ED Notes (Signed)
Pt called this nurse to his room, states "Get this stuff off of me, I'm getting out of here. I will go to my regular doctor tomorrow". Pt reports that he doesn't want to wait any longer. Advised pt that care should be completed for adequate diagnosis. Pt reports he's "already been here for an hour and forty-five minutes" and doesn't want to wait any longer.

## 2021-07-16 NOTE — ED Provider Notes (Signed)
[facility] Emergency Department Provider Note MRN:  [id number]  Arrival date & time: 07/16/21     Chief Complaint   Shortness of Breath   History of Present Illness   [name] is a 45 y.o. year-old male with a history of diabetes, COPD presenting to the ED with chief complaint of shortness of breath.  Patient is 5 days removed from surgery.  Had surgery for thoracic outlet syndrome.  He says ever since he woke up from anesthesia he has been having issues with trouble breathing.  Pain with deep breaths, feels short of breath at all times, worse when laying flat.  Occasionally feels pain in the shoulder on the right, which is where the surgery was done.  But also feels pleuritic chest pain on the left lateral side of his chest.  Feeling lightheaded all day today as well.  Denies abdominal pain, no fever, no cough, no other complaints.  Review of Systems  A complete 10 system review of systems was obtained and all systems are negative except as noted in the HPI and PMH.   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 but needs one    Past Surgical History:  Procedure Laterality Date   BACK SURGERY     EYE SURGERY     LUMBAR LAMINECTOMY/DECOMPRESSION MICRODISCECTOMY Left 01/10/2020   Procedure: LEFT LUMBAR FIVE- SACRAL ONE EXTRAFORAMINAL MICRODISCECTOMY;  Surgeon: Consuella Lose, MD;  Location: [facility];  Service: Neurosurgery;  Laterality: Left;    Family History  Problem Relation Age of Onset   Heart Problems Mother    Heart attack Father    Diabetes Father    Asthma Sister     Social History    Socioeconomic History   Marital status: Legally Separated    Spouse name: Not on file   Number of children: Not on file   Years of education: Not on file   Highest education level: Not on file  Occupational History   Occupation: retired./disabled  Tobacco Use   Smoking status: Former    Packs/day: 2.00    Years: 21.00    Pack years: 42.00    Types: Cigarettes    Quit date: 12/31/2019    Years since quitting: 1.5   Smokeless tobacco: Never  Vaping Use   Vaping Use: Never used  Substance and Sexual Activity   Alcohol use: No   Drug use: No   Sexual activity: Yes  Other Topics Concern   Not on file  Social History Narrative   Not on file   Social Determinants of Health   Financial Resource Strain: Not on file  Food Insecurity: Not on file  Transportation Needs: Not on file  Physical Activity: Not on file  Stress: Not on file  Social Connections: Not on file  Intimate Partner Violence: Not on file     Physical Exam   Vitals:   07/16/21 0000 07/16/21 0030  BP: (!) 167/102 (!) 163/90  Pulse: (!) 103 (!) 108  Resp: (!) 21 (!) 28  Temp:    SpO2: 94% 95%    CONSTITUTIONAL: Well-appearing, NAD NEURO:  Alert and oriented x 3, no focal deficits EYES:  eyes equal and reactive ENT/NECK:  no LAD, no JVD CARDIO: Regular rate, well-perfused, normal S1 and S2 PULM:  CTAB no wheezing or rhonchi GI/GU:  normal bowel sounds, non-distended, non-tender MSK/SPINE:  No gross deformities, no edema SKIN:  no rash, atraumatic PSYCH:  Appropriate speech and behavior  *Additional and/or pertinent findings included in MDM below  Diagnostic and Interventional Summary    EKG Interpretation  Date/Time:  Monday July 15 2021 23:29:46 EDT Ventricular Rate:  102 PR Interval:  151 QRS Duration: 117 QT Interval:  309 QTC Calculation: 403 R Axis:   -75 Text Interpretation: Sinus tachycardia Probable left atrial enlargement Incomplete RBBB and LAFB Anteroseptal infarct, age  indeterminate Confirmed by Gerlene Fee [id number]) on 07/16/2021 7:12:35 AM       Labs Reviewed  BASIC METABOLIC PANEL - Abnormal; Notable for the following components:      Result Value   Sodium 131 (*)    Glucose, Bld 389 (*)    All other components within normal limits  CBC  BRAIN NATRIURETIC PEPTIDE  PROTIME-INR  TROPONIN I (HIGH SENSITIVITY)    DG Chest Port 1 View  Final Result      Medications - No data to display   Procedures  /  Critical Care Procedures  ED Course and Medical Decision Making  I have reviewed the triage vital signs, the nursing notes, and pertinent available records from the EMR.  Listed above are laboratory and imaging tests that I personally ordered, reviewed, and interpreted and then considered in my medical decision making (see below for details).  Concern for PE versus atelectasis versus postoperative pneumonia, will need CTA imaging.     Patient suddenly wanted to leave, left with incomplete evaluation, signed the [facility] form with nursing staff.  Barth Kirks. Sedonia Small, [place] [email or web address]  Final Clinical Impressions(s) / ED Diagnoses     ICD-10-CM   1. SOB (shortness of breath)  R06.02       ED Discharge Orders     None        Discharge Instructions Discussed with and Provided to Patient:   Discharge Instructions   None       Maudie Flakes, MD 07/16/21 [phone]

## 2021-08-27 ENCOUNTER — Institutional Professional Consult (permissible substitution): Payer: Medicare Other | Admitting: Internal Medicine

## 2021-08-27 NOTE — Progress Notes (Deleted)
   [name], male    DOB: 05-07-1976, 45 y.o.   MRN: [id number]   Brief patient profile:  referred to pulmonary clinic 08/27/2021 by *** for ***        History of Present Illness  08/27/2021  Pulmonary/ 1st office eval/Merrilyn Legler  No chief complaint on file.    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 but needs one    Outpatient Medications Prior to Visit  Medication Sig Dispense Refill   amLODipine (NORVASC) 10 MG tablet Take 10 mg by mouth daily.     baclofen (LIORESAL) 10 MG tablet Take 10 mg by mouth 3 (three) times daily.     benazepril-hydrochlorthiazide (LOTENSIN HCT) 10-12.5 MG tablet Take 1 tablet by mouth daily.     Colloidal Oatmeal (ECZEMA MOISTURIZING EX) Apply 1 application topically daily as needed (eczema).     diclofenac (VOLTAREN) 75 MG EC tablet Take 1 tablet by mouth 2 (two) times daily.     gabapentin (NEURONTIN) 300 MG capsule Take 300 mg by mouth 4 (four) times daily.     hydrOXYzine (ATARAX/VISTARIL) 25 MG tablet Take 1 tablet (25 mg total) by mouth every 6 (six) hours as needed for itching. 30 tablet 0   ibuprofen (ADVIL,MOTRIN) 200 MG tablet Take 600 mg by mouth every 6 (six) hours as needed for moderate pain.      JANUVIA 50 MG tablet Take 50 mg by mouth daily.     LORazepam (ATIVAN) 0.5 MG tablet Take 1 tablet by mouth daily as needed for anxiety.      omeprazole (PRILOSEC) 20 MG capsule Take 1 capsule by mouth daily.     oxymorphone (OPANA) 5 MG tablet Take 5 mg by mouth 4 (four) times daily.   0   predniSONE (DELTASONE) 20 MG tablet 3 tabs po daily x 3 days, then 2 tabs x  3 days, then 1.5 tabs x 3 days, then 1 tab x 3 days, then 0.5 tabs x 3 days 27 tablet 0   PROAIR HFA 108 (90 Base) MCG/ACT inhaler Inhale 2 puffs into the lungs every 4 (four) hours as needed for shortness of breath.     SPIRIVA HANDIHALER 18 MCG inhalation capsule Place 18 mcg into inhaler and inhale daily.     trolamine salicylate (ASPERCREME) 10 % cream Apply 1 application topically as needed for muscle pain.     No facility-administered medications prior to visit.     Objective:     There were no vitals taken for this visit.         Assessment   No problem-specific Assessment & Plan notes found for this encounter.     Christinia Gully, MD 08/27/2021

## 2021-12-10 ENCOUNTER — Institutional Professional Consult (permissible substitution): Payer: Medicare Other | Admitting: Pulmonary Disease

## 2022-01-06 ENCOUNTER — Other Ambulatory Visit (HOSPITAL_BASED_OUTPATIENT_CLINIC_OR_DEPARTMENT_OTHER): Payer: Self-pay

## 2022-01-06 MED ORDER — OXYMORPHONE HCL 5 MG PO TABS
ORAL_TABLET | ORAL | 0 refills | Status: AC
  Filled 2022-01-06: qty 120, 30d supply, fill #0

## 2022-01-06 MED ORDER — BACLOFEN 10 MG PO TABS
ORAL_TABLET | ORAL | 2 refills | Status: AC
  Filled 2022-01-06 – 2022-08-08 (×2): qty 90, 30d supply, fill #0
  Filled 2022-10-06: qty 90, 30d supply, fill #1

## 2022-01-06 MED ORDER — OXYMORPHONE HCL 5 MG PO TABS
ORAL_TABLET | ORAL | 0 refills | Status: DC
  Filled 2022-01-06 – 2022-02-05 (×2): qty 120, 30d supply, fill #0

## 2022-01-07 ENCOUNTER — Other Ambulatory Visit (HOSPITAL_BASED_OUTPATIENT_CLINIC_OR_DEPARTMENT_OTHER): Payer: Self-pay

## 2022-01-30 ENCOUNTER — Other Ambulatory Visit (HOSPITAL_BASED_OUTPATIENT_CLINIC_OR_DEPARTMENT_OTHER): Payer: Self-pay

## 2022-02-05 ENCOUNTER — Other Ambulatory Visit (HOSPITAL_BASED_OUTPATIENT_CLINIC_OR_DEPARTMENT_OTHER): Payer: Self-pay

## 2022-02-26 ENCOUNTER — Other Ambulatory Visit: Payer: Self-pay | Admitting: Neurosurgery

## 2022-02-26 ENCOUNTER — Other Ambulatory Visit (HOSPITAL_COMMUNITY): Payer: Self-pay | Admitting: Neurosurgery

## 2022-02-26 ENCOUNTER — Other Ambulatory Visit (HOSPITAL_BASED_OUTPATIENT_CLINIC_OR_DEPARTMENT_OTHER): Payer: Self-pay

## 2022-02-26 DIAGNOSIS — M5136 Other intervertebral disc degeneration, lumbar region: Secondary | ICD-10-CM

## 2022-02-26 MED ORDER — OXYMORPHONE HCL 5 MG PO TABS
ORAL_TABLET | ORAL | 0 refills | Status: AC
  Filled 2022-03-07: qty 80, 20d supply, fill #0
  Filled 2022-03-07: qty 40, 10d supply, fill #0

## 2022-02-26 MED ORDER — OXYMORPHONE HCL 5 MG PO TABS
ORAL_TABLET | ORAL | 0 refills | Status: DC
  Filled 2022-05-05: qty 100, 25d supply, fill #0
  Filled 2022-05-05: qty 20, 5d supply, fill #0

## 2022-02-26 MED ORDER — OXYMORPHONE HCL 5 MG PO TABS
ORAL_TABLET | ORAL | 0 refills | Status: AC
  Filled 2022-04-04: qty 20, 5d supply, fill #0
  Filled 2022-04-04: qty 100, 25d supply, fill #0

## 2022-03-07 ENCOUNTER — Other Ambulatory Visit (HOSPITAL_BASED_OUTPATIENT_CLINIC_OR_DEPARTMENT_OTHER): Payer: Self-pay

## 2022-03-14 ENCOUNTER — Ambulatory Visit (HOSPITAL_COMMUNITY): Payer: Medicare Other

## 2022-04-02 ENCOUNTER — Ambulatory Visit (HOSPITAL_COMMUNITY)
Admission: RE | Admit: 2022-04-02 | Discharge: 2022-04-02 | Disposition: A | Discharge status: Home / Self Care | Payer: Medicare Other | Source: Ambulatory Visit | Attending: Neurosurgery | Admitting: Neurosurgery

## 2022-04-02 ENCOUNTER — Other Ambulatory Visit (HOSPITAL_BASED_OUTPATIENT_CLINIC_OR_DEPARTMENT_OTHER): Payer: Self-pay

## 2022-04-02 DIAGNOSIS — M5136 Other intervertebral disc degeneration, lumbar region: Secondary | ICD-10-CM | POA: Insufficient documentation

## 2022-04-03 ENCOUNTER — Other Ambulatory Visit (HOSPITAL_BASED_OUTPATIENT_CLINIC_OR_DEPARTMENT_OTHER): Payer: Self-pay

## 2022-04-04 ENCOUNTER — Other Ambulatory Visit (HOSPITAL_BASED_OUTPATIENT_CLINIC_OR_DEPARTMENT_OTHER): Payer: Self-pay

## 2022-05-02 ENCOUNTER — Other Ambulatory Visit (HOSPITAL_BASED_OUTPATIENT_CLINIC_OR_DEPARTMENT_OTHER): Payer: Self-pay

## 2022-05-05 ENCOUNTER — Other Ambulatory Visit (HOSPITAL_BASED_OUTPATIENT_CLINIC_OR_DEPARTMENT_OTHER): Payer: Self-pay

## 2022-05-14 ENCOUNTER — Other Ambulatory Visit (HOSPITAL_BASED_OUTPATIENT_CLINIC_OR_DEPARTMENT_OTHER): Payer: Self-pay

## 2022-05-14 MED ORDER — OXYMORPHONE HCL 5 MG PO TABS
ORAL_TABLET | ORAL | 0 refills | Status: DC
  Filled 2022-08-06: qty 120, 30d supply, fill #0

## 2022-05-14 MED ORDER — OXYMORPHONE HCL 5 MG PO TABS
ORAL_TABLET | ORAL | 0 refills | Status: AC
  Filled 2022-05-14 – 2022-06-03 (×2): qty 120, 30d supply, fill #0

## 2022-05-14 MED ORDER — OXYMORPHONE HCL 5 MG PO TABS
ORAL_TABLET | ORAL | 0 refills | Status: AC
  Filled 2022-07-07: qty 120, 30d supply, fill #0

## 2022-05-14 MED ORDER — BACLOFEN 10 MG PO TABS
ORAL_TABLET | ORAL | 2 refills | Status: AC
  Filled 2022-05-14 – 2022-06-03 (×3): qty 90, 30d supply, fill #0
  Filled 2022-07-07: qty 90, 30d supply, fill #1
  Filled 2022-09-01: qty 90, 30d supply, fill #2

## 2022-06-02 ENCOUNTER — Other Ambulatory Visit (HOSPITAL_BASED_OUTPATIENT_CLINIC_OR_DEPARTMENT_OTHER): Payer: Self-pay

## 2022-06-03 ENCOUNTER — Other Ambulatory Visit (HOSPITAL_BASED_OUTPATIENT_CLINIC_OR_DEPARTMENT_OTHER): Payer: Self-pay

## 2022-07-07 ENCOUNTER — Other Ambulatory Visit (HOSPITAL_BASED_OUTPATIENT_CLINIC_OR_DEPARTMENT_OTHER): Payer: Self-pay

## 2022-07-28 ENCOUNTER — Other Ambulatory Visit (HOSPITAL_BASED_OUTPATIENT_CLINIC_OR_DEPARTMENT_OTHER): Payer: Self-pay

## 2022-07-28 MED ORDER — BACLOFEN 10 MG PO TABS
10.0000 mg | ORAL_TABLET | Freq: Three times a day (TID) | ORAL | 2 refills | Status: AC
  Filled 2022-11-04: qty 90, 30d supply, fill #0
  Filled 2022-12-04: qty 90, 30d supply, fill #1
  Filled 2023-01-03 – 2023-01-30 (×2): qty 90, 30d supply, fill #2

## 2022-07-28 MED ORDER — OXYMORPHONE HCL 5 MG PO TABS: 5.0000 mg | ORAL_TABLET | Freq: Four times a day (QID) | ORAL | 0 refills | Status: DC

## 2022-08-06 ENCOUNTER — Other Ambulatory Visit (HOSPITAL_BASED_OUTPATIENT_CLINIC_OR_DEPARTMENT_OTHER): Payer: Self-pay

## 2022-08-07 ENCOUNTER — Other Ambulatory Visit (HOSPITAL_BASED_OUTPATIENT_CLINIC_OR_DEPARTMENT_OTHER): Payer: Self-pay

## 2022-08-08 ENCOUNTER — Other Ambulatory Visit (HOSPITAL_BASED_OUTPATIENT_CLINIC_OR_DEPARTMENT_OTHER): Payer: Self-pay

## 2022-08-08 MED ORDER — OXYCODONE-ACETAMINOPHEN 10-325 MG PO TABS
ORAL_TABLET | ORAL | 0 refills | Status: AC
  Filled 2022-08-08: qty 120, 30d supply, fill #0

## 2022-09-01 ENCOUNTER — Other Ambulatory Visit (HOSPITAL_BASED_OUTPATIENT_CLINIC_OR_DEPARTMENT_OTHER): Payer: Self-pay

## 2022-09-03 ENCOUNTER — Other Ambulatory Visit (HOSPITAL_BASED_OUTPATIENT_CLINIC_OR_DEPARTMENT_OTHER): Payer: Self-pay

## 2022-09-03 MED ORDER — OXYCODONE-ACETAMINOPHEN 10-325 MG PO TABS
1.0000 | ORAL_TABLET | Freq: Four times a day (QID) | ORAL | 0 refills | Status: DC | PRN
  Filled 2022-09-06: qty 120, 30d supply, fill #0
  Filled ????-??-??: fill #0

## 2022-09-06 ENCOUNTER — Other Ambulatory Visit (HOSPITAL_BASED_OUTPATIENT_CLINIC_OR_DEPARTMENT_OTHER): Payer: Self-pay

## 2022-09-08 ENCOUNTER — Other Ambulatory Visit (HOSPITAL_BASED_OUTPATIENT_CLINIC_OR_DEPARTMENT_OTHER): Payer: Self-pay

## 2022-10-06 ENCOUNTER — Other Ambulatory Visit (HOSPITAL_BASED_OUTPATIENT_CLINIC_OR_DEPARTMENT_OTHER): Payer: Self-pay

## 2022-10-06 MED ORDER — OXYCODONE-ACETAMINOPHEN 10-325 MG PO TABS
1.0000 | ORAL_TABLET | Freq: Four times a day (QID) | ORAL | 0 refills | Status: AC | PRN
  Filled 2022-10-06: qty 120, 30d supply, fill #0

## 2022-10-22 ENCOUNTER — Other Ambulatory Visit (HOSPITAL_BASED_OUTPATIENT_CLINIC_OR_DEPARTMENT_OTHER): Payer: Self-pay

## 2022-10-22 MED ORDER — OXYCODONE-ACETAMINOPHEN 10-325 MG PO TABS
1.0000 | ORAL_TABLET | Freq: Four times a day (QID) | ORAL | 0 refills | Status: AC | PRN
  Filled 2022-11-04: qty 120, 30d supply, fill #0

## 2022-11-04 ENCOUNTER — Other Ambulatory Visit (HOSPITAL_BASED_OUTPATIENT_CLINIC_OR_DEPARTMENT_OTHER): Payer: Self-pay

## 2022-11-19 ENCOUNTER — Other Ambulatory Visit (HOSPITAL_BASED_OUTPATIENT_CLINIC_OR_DEPARTMENT_OTHER): Payer: Self-pay

## 2022-11-19 MED ORDER — OXYCODONE-ACETAMINOPHEN 10-325 MG PO TABS
1.0000 | ORAL_TABLET | Freq: Four times a day (QID) | ORAL | 0 refills | Status: AC | PRN
  Filled 2022-12-04: qty 120, 30d supply, fill #0

## 2022-11-19 MED ORDER — BACLOFEN 10 MG PO TABS
10.0000 mg | ORAL_TABLET | Freq: Three times a day (TID) | ORAL | 11 refills | Status: DC
  Filled 2023-01-03: qty 90, 30d supply, fill #0
  Filled 2023-03-02: qty 90, 30d supply, fill #1
  Filled 2023-03-31 – 2023-04-29 (×3): qty 90, 30d supply, fill #2
  Filled 2023-05-29 (×2): qty 90, 30d supply, fill #3
  Filled 2023-06-26: qty 90, 30d supply, fill #4
  Filled 2023-07-28: qty 90, 30d supply, fill #5
  Filled 2023-08-26: qty 90, 30d supply, fill #6
  Filled 2023-09-25: qty 90, 30d supply, fill #7
  Filled 2023-10-24 (×2): qty 90, 30d supply, fill #8

## 2022-11-19 MED ORDER — OXYCODONE-ACETAMINOPHEN 10-325 MG PO TABS
1.0000 | ORAL_TABLET | Freq: Four times a day (QID) | ORAL | 0 refills | Status: AC | PRN
  Filled 2023-01-31: qty 120, 30d supply, fill #0

## 2022-11-19 MED ORDER — OXYCODONE-ACETAMINOPHEN 10-325 MG PO TABS
1.0000 | ORAL_TABLET | Freq: Four times a day (QID) | ORAL | 0 refills | Status: AC | PRN
  Filled 2023-01-03: qty 120, 30d supply, fill #0

## 2022-12-04 ENCOUNTER — Other Ambulatory Visit (HOSPITAL_BASED_OUTPATIENT_CLINIC_OR_DEPARTMENT_OTHER): Payer: Self-pay

## 2022-12-31 ENCOUNTER — Other Ambulatory Visit (HOSPITAL_BASED_OUTPATIENT_CLINIC_OR_DEPARTMENT_OTHER): Payer: Self-pay

## 2022-12-31 ENCOUNTER — Other Ambulatory Visit: Payer: Self-pay

## 2023-01-03 ENCOUNTER — Other Ambulatory Visit (HOSPITAL_COMMUNITY): Payer: Self-pay

## 2023-01-03 ENCOUNTER — Other Ambulatory Visit (HOSPITAL_BASED_OUTPATIENT_CLINIC_OR_DEPARTMENT_OTHER): Payer: Self-pay

## 2023-01-03 ENCOUNTER — Other Ambulatory Visit: Payer: Self-pay

## 2023-01-30 ENCOUNTER — Other Ambulatory Visit (HOSPITAL_BASED_OUTPATIENT_CLINIC_OR_DEPARTMENT_OTHER): Payer: Self-pay

## 2023-01-31 ENCOUNTER — Other Ambulatory Visit (HOSPITAL_BASED_OUTPATIENT_CLINIC_OR_DEPARTMENT_OTHER): Payer: Self-pay

## 2023-02-18 ENCOUNTER — Other Ambulatory Visit (HOSPITAL_BASED_OUTPATIENT_CLINIC_OR_DEPARTMENT_OTHER): Payer: Self-pay

## 2023-02-18 MED ORDER — OXYCODONE-ACETAMINOPHEN 10-325 MG PO TABS
1.0000 | ORAL_TABLET | Freq: Four times a day (QID) | ORAL | 0 refills | Status: AC | PRN
  Filled 2023-03-31: qty 120, 30d supply, fill #0

## 2023-02-18 MED ORDER — OXYCODONE-ACETAMINOPHEN 10-325 MG PO TABS
1.0000 | ORAL_TABLET | Freq: Four times a day (QID) | ORAL | 0 refills | Status: AC | PRN
  Filled 2023-03-02: qty 120, 30d supply, fill #0

## 2023-02-18 MED ORDER — OXYCODONE-ACETAMINOPHEN 10-325 MG PO TABS
1.0000 | ORAL_TABLET | Freq: Four times a day (QID) | ORAL | 0 refills | Status: AC | PRN
  Filled 2023-04-30: qty 120, 30d supply, fill #0

## 2023-02-26 ENCOUNTER — Other Ambulatory Visit (HOSPITAL_BASED_OUTPATIENT_CLINIC_OR_DEPARTMENT_OTHER): Payer: Self-pay

## 2023-03-02 ENCOUNTER — Other Ambulatory Visit (HOSPITAL_BASED_OUTPATIENT_CLINIC_OR_DEPARTMENT_OTHER): Payer: Self-pay

## 2023-03-02 ENCOUNTER — Other Ambulatory Visit: Payer: Self-pay

## 2023-03-30 ENCOUNTER — Other Ambulatory Visit (HOSPITAL_BASED_OUTPATIENT_CLINIC_OR_DEPARTMENT_OTHER): Payer: Self-pay

## 2023-03-30 ENCOUNTER — Other Ambulatory Visit (HOSPITAL_COMMUNITY): Payer: Self-pay

## 2023-03-31 ENCOUNTER — Other Ambulatory Visit: Payer: Self-pay

## 2023-03-31 ENCOUNTER — Other Ambulatory Visit (HOSPITAL_BASED_OUTPATIENT_CLINIC_OR_DEPARTMENT_OTHER): Payer: Self-pay

## 2023-04-08 ENCOUNTER — Other Ambulatory Visit (HOSPITAL_BASED_OUTPATIENT_CLINIC_OR_DEPARTMENT_OTHER): Payer: Self-pay

## 2023-04-29 ENCOUNTER — Other Ambulatory Visit (HOSPITAL_BASED_OUTPATIENT_CLINIC_OR_DEPARTMENT_OTHER): Payer: Self-pay

## 2023-04-30 ENCOUNTER — Other Ambulatory Visit: Payer: Self-pay

## 2023-04-30 ENCOUNTER — Other Ambulatory Visit (HOSPITAL_BASED_OUTPATIENT_CLINIC_OR_DEPARTMENT_OTHER): Payer: Self-pay

## 2023-05-07 ENCOUNTER — Other Ambulatory Visit (HOSPITAL_BASED_OUTPATIENT_CLINIC_OR_DEPARTMENT_OTHER): Payer: Self-pay

## 2023-05-07 MED ORDER — OXYCODONE-ACETAMINOPHEN 10-325 MG PO TABS
1.0000 | ORAL_TABLET | Freq: Four times a day (QID) | ORAL | 0 refills | Status: AC | PRN
  Filled 2023-05-29 (×2): qty 120, 30d supply, fill #0

## 2023-05-11 ENCOUNTER — Other Ambulatory Visit: Payer: Self-pay

## 2023-05-11 ENCOUNTER — Encounter (HOSPITAL_COMMUNITY): Payer: Self-pay

## 2023-05-11 ENCOUNTER — Emergency Department (HOSPITAL_COMMUNITY)
Admission: EM | Admit: 2023-05-11 | Discharge: 2023-05-11 | Disposition: A | Discharge status: Home / Self Care | Payer: 59 | Attending: Emergency Medicine | Admitting: Emergency Medicine

## 2023-05-11 ENCOUNTER — Emergency Department (HOSPITAL_COMMUNITY): Payer: 59

## 2023-05-11 DIAGNOSIS — M25571 Pain in right ankle and joints of right foot: Secondary | ICD-10-CM | POA: Insufficient documentation

## 2023-05-11 NOTE — Discharge Instructions (Signed)
Follow up with your Physicain for recheck  

## 2023-05-11 NOTE — ED Triage Notes (Signed)
Pt c/o ankle pain that started Friday on rR ankle midline  described as tooth ache pain and is unable to flex foot.

## 2023-05-12 NOTE — ED Provider Notes (Signed)
[place] EMERGENCY DEPARTMENT AT [facility] Provider Note   CSN: [id number] Arrival date & time: 05/11/23  1001     History  Chief Complaint  Patient presents with   Ankle Pain    [name] is a 47 y.o. male.  Patient complains of right ankle pain.  Patient reports that he has been told that he has a loose bone in his ankle.  Patient states that sometimes he moves his ankle and the bone moves and then he has trouble moving his ankle.  Patient is requesting an x-ray of his ankle.  The history is provided by the patient. No language interpreter was used.  Ankle Pain Location:  Ankle      Home Medications Prior to Admission medications   Medication Sig Start Date End Date Taking? Authorizing Provider  amLODipine (NORVASC) 10 MG tablet Take 10 mg by mouth daily. 07/29/16   [provider]  baclofen (LIORESAL) 10 MG tablet Take 10 mg by mouth 3 (three) times daily. 08/04/16   [provider]  baclofen (LIORESAL) 10 MG tablet take 1 tablet by oral route 3 times every day 01/06/22     baclofen (LIORESAL) 10 MG tablet take 1 tablet by oral route 3 times every day 05/14/22     baclofen (LIORESAL) 10 MG tablet Take 1 tablet (10 mg total) by mouth 3 (three) times daily. 07/28/22     baclofen (LIORESAL) 10 MG tablet Take 1 tablet (10 mg total) by mouth 3 (three) times daily. 11/19/22     benazepril-hydrochlorthiazide (LOTENSIN HCT) 10-12.5 MG tablet Take 1 tablet by mouth daily. 07/29/16   [provider]  Colloidal Oatmeal (ECZEMA MOISTURIZING EX) Apply 1 application topically daily as needed (eczema).    [provider]  diclofenac (VOLTAREN) 75 MG EC tablet Take 1 tablet by mouth 2 (two) times daily. 07/31/18   [provider]  gabapentin (NEURONTIN) 300 MG capsule Take 300 mg by mouth 4 (four) times daily. 12/24/19   [provider]  hydrOXYzine (ATARAX/VISTARIL) 25 MG tablet Take 1 tablet (25 mg total) by mouth every 6  (six) hours as needed for itching. 01/20/20   Mesner, Barbara Cower, MD  ibuprofen (ADVIL,MOTRIN) 200 MG tablet Take 600 mg by mouth every 6 (six) hours as needed for moderate pain.     [provider]  JANUVIA 50 MG tablet Take 50 mg by mouth daily. 10/25/19   [provider]  LORazepam (ATIVAN) 0.5 MG tablet Take 1 tablet by mouth daily as needed for anxiety.  04/27/18   [provider]  omeprazole (PRILOSEC) 20 MG capsule Take 1 capsule by mouth daily. 07/30/18   [provider]  oxyCODONE-acetaminophen (PERCOCET) 10-325 MG tablet take 1 tablet by mouth every 6 hours as needed 08/08/22     oxyCODONE-acetaminophen (PERCOCET) 10-325 MG tablet Take 1 tablet by mouth every 6 (six) hours as needed. 10/06/22     oxyCODONE-acetaminophen (PERCOCET) 10-325 MG tablet Take 1 tablet by mouth every 6 (six) hours as needed (Not to exceed 4 per day). 11/04/22     oxyCODONE-acetaminophen (PERCOCET) 10-325 MG tablet Take 1 tablet by mouth every 6 (six) hours as needed. 01/03/23     oxyCODONE-acetaminophen (PERCOCET) 10-325 MG tablet Take 1 tablet by mouth every 6 (six) hours as needed. 12/04/22     oxyCODONE-acetaminophen (PERCOCET) 10-325 MG tablet Take 1 tablet by mouth every 6 (six) hours as needed. 01/31/23     oxyCODONE-acetaminophen (PERCOCET) 10-325 MG tablet Take  1 tablet by mouth every 6 (six) hours as needed. 03/02/23     oxyCODONE-acetaminophen (PERCOCET) 10-325 MG tablet Take 1 tablet by mouth every 6 (six) hours as needed. 03/31/23     oxyCODONE-acetaminophen (PERCOCET) 10-325 MG tablet Take 1 tablet by mouth every 6 (six) hours as needed. 04/30/23     oxyCODONE-acetaminophen (PERCOCET) 10-325 MG tablet take 1 tablet by oral route  every 6 hours as needed, NTE 4/day (DNF 05/29/23) 05/29/23     oxymorphone (OPANA) 5 MG tablet Take 5 mg by mouth 4 (four) times daily.  09/27/14   [provider]  oxymorphone (OPANA) 5 MG tablet take 1 tablet by oral route  every 6 hours on an  empty stomach (DNF 01/06/22) 01/06/22     oxymorphone (OPANA) 5 MG tablet take 1 tablet by oral route  every 6 hours on an empty stomach 02/26/22     oxymorphone (OPANA) 5 MG tablet take 1 tablet by oral route  every 6 hours on an empty stomach (DNF 04/04/22) 02/26/22     oxymorphone (OPANA) 5 MG tablet take 1 tablet by oral route  every 6 hours on an empty stomach (DNF 07/03/22) 07/03/22     oxymorphone (OPANA) 5 MG tablet take 1 tablet by oral route  every 6 hours on an empty stomach (DNF 06/03/22) 05/14/22     predniSONE (DELTASONE) 20 MG tablet 3 tabs po daily x 3 days, then 2 tabs x 3 days, then 1.5 tabs x 3 days, then 1 tab x 3 days, then 0.5 tabs x 3 days 01/20/20   Mesner, Barbara Cower, MD  PROAIR HFA 108 [phone] Base) MCG/ACT inhaler Inhale 2 puffs into the lungs every 4 (four) hours as needed for shortness of breath. 07/25/19   [provider]  SPIRIVA HANDIHALER 18 MCG inhalation capsule Place 18 mcg into inhaler and inhale daily. 08/04/16   [provider]  trolamine salicylate (ASPERCREME) 10 % cream Apply 1 application topically as needed for muscle pain.    [provider]      Allergies    Clindamycin/lincomycin    Review of Systems   Review of Systems  All other systems reviewed and are negative.   Physical Exam Updated Vital Signs BP (!) 143/85   Pulse 79   SpO2 96%  Physical Exam Vitals and nursing note reviewed.  Constitutional:      Appearance: He is well-developed.  HENT:     Head: Normocephalic.  Cardiovascular:     Rate and Rhythm: Normal rate.  Pulmonary:     Effort: Pulmonary effort is normal.  Abdominal:     General: There is no distension.  Musculoskeletal:        General: Tenderness present. No swelling.     Comments: Tender right ankle no erythema no edema decreased range of motion.  Neurovascular neurosensory are intact  Skin:    General: Skin is warm.  Neurological:     General: No focal deficit present.     Mental Status: He is alert and  oriented to person, place, and time.     ED Results / Procedures / Treatments   Labs (all labs ordered are listed, but only abnormal results are displayed) Labs Reviewed - No data to display  EKG None  Radiology DG Ankle Complete Right  Result Date: 05/11/2023 CLINICAL DATA:  47 year old male with ankle pain since last week. Dull. Painful flexion. EXAM: RIGHT ANKLE - COMPLETE 3+ VIEW COMPARISON:  Right ankle series 08/19/2018. FINDINGS:  Bone mineralization is within normal limits. Maintained mortise joint alignment. Talar dome is intact. No evidence of joint effusion. Chronic mild cortical irregularity and/or small chronic ossific fragment along the neck of the talus appears unchanged since 2019. Mild to moderate chronic degenerative spurring of the medial malleolus also. Calcaneus appears stable and intact with mild degenerative spurring. No acute osseous abnormality identified. IMPRESSION: Chronic posttraumatic and/or degenerative changes. No acute osseous abnormality identified. Electronically Signed   By: Odessa Fleming M.D.   On: 05/11/2023 11:36    Procedures Procedures    Medications Ordered in ED Medications - No data to display  ED Course/ Medical Decision Making/ A&P                             Medical Decision Making Complains of pain in his right ankle.  Amount and/or Complexity of Data Reviewed Radiology: ordered and independent interpretation performed. Decision-making details documented in ED Course.    Details: X-ray right ankle shows degenerative changes  Risk Risk Details: Patient counseled on results he is advised to follow-up with his orthopedist patient is in pain management he is advised to continue his home medications patient is given crutches to help with ambulation.           Final Clinical Impression(s) / ED Diagnoses Final diagnoses:  Acute right ankle pain    Rx / DC Orders ED Discharge Orders     None      An After Visit Summary was  printed and given to the patient.    Elson Areas, PA-C 05/12/23 1420    Tanda Rockers A, DO 05/14/23 [phone]

## 2023-05-27 ENCOUNTER — Other Ambulatory Visit (HOSPITAL_BASED_OUTPATIENT_CLINIC_OR_DEPARTMENT_OTHER): Payer: Self-pay

## 2023-05-29 ENCOUNTER — Other Ambulatory Visit (HOSPITAL_BASED_OUTPATIENT_CLINIC_OR_DEPARTMENT_OTHER): Payer: Self-pay

## 2023-06-25 ENCOUNTER — Other Ambulatory Visit (HOSPITAL_BASED_OUTPATIENT_CLINIC_OR_DEPARTMENT_OTHER): Payer: Self-pay

## 2023-06-25 MED ORDER — OXYCODONE HCL 10 MG PO TABS
10.0000 mg | ORAL_TABLET | Freq: Four times a day (QID) | ORAL | 0 refills | Status: AC
  Filled 2023-06-29: qty 120, 30d supply, fill #0

## 2023-06-25 MED ORDER — OXYCODONE HCL 10 MG PO TABS
10.0000 mg | ORAL_TABLET | Freq: Four times a day (QID) | ORAL | 0 refills | Status: AC
  Filled 2023-07-28: qty 120, 30d supply, fill #0

## 2023-06-25 MED ORDER — OXYCODONE HCL 10 MG PO TABS
10.0000 mg | ORAL_TABLET | Freq: Four times a day (QID) | ORAL | 0 refills | Status: AC
  Filled 2023-08-26: qty 120, 30d supply, fill #0

## 2023-06-26 ENCOUNTER — Other Ambulatory Visit (HOSPITAL_BASED_OUTPATIENT_CLINIC_OR_DEPARTMENT_OTHER): Payer: Self-pay

## 2023-06-27 ENCOUNTER — Other Ambulatory Visit (HOSPITAL_BASED_OUTPATIENT_CLINIC_OR_DEPARTMENT_OTHER): Payer: Self-pay

## 2023-06-29 ENCOUNTER — Other Ambulatory Visit (HOSPITAL_BASED_OUTPATIENT_CLINIC_OR_DEPARTMENT_OTHER): Payer: Self-pay

## 2023-06-29 ENCOUNTER — Other Ambulatory Visit: Payer: Self-pay

## 2023-07-27 ENCOUNTER — Other Ambulatory Visit: Payer: Self-pay

## 2023-07-28 ENCOUNTER — Other Ambulatory Visit (HOSPITAL_BASED_OUTPATIENT_CLINIC_OR_DEPARTMENT_OTHER): Payer: Self-pay

## 2023-08-26 ENCOUNTER — Other Ambulatory Visit (HOSPITAL_COMMUNITY): Payer: Self-pay

## 2023-08-26 ENCOUNTER — Other Ambulatory Visit (HOSPITAL_BASED_OUTPATIENT_CLINIC_OR_DEPARTMENT_OTHER): Payer: Self-pay

## 2023-09-08 ENCOUNTER — Other Ambulatory Visit (HOSPITAL_BASED_OUTPATIENT_CLINIC_OR_DEPARTMENT_OTHER): Payer: Self-pay

## 2023-09-08 MED ORDER — OXYCODONE HCL 10 MG PO TABS
10.0000 mg | ORAL_TABLET | Freq: Four times a day (QID) | ORAL | 0 refills | Status: AC
  Filled 2023-09-25: qty 120, 30d supply, fill #0

## 2023-09-08 MED ORDER — OXYCODONE HCL 10 MG PO TABS
10.0000 mg | ORAL_TABLET | Freq: Four times a day (QID) | ORAL | 0 refills | Status: AC
  Filled 2023-10-24 (×2): qty 120, 30d supply, fill #0

## 2023-09-08 MED ORDER — OXYCODONE HCL 10 MG PO TABS
10.0000 mg | ORAL_TABLET | Freq: Four times a day (QID) | ORAL | 0 refills | Status: DC
  Filled 2023-11-23: qty 120, 30d supply, fill #0

## 2023-09-16 IMAGING — MR MR LUMBAR SPINE W/O CM
5 series · 31 of 48 positions shown · non-contrast
Comparison: Lumbar MRI 05/28/2020. Lumbar radiographs [HOSPITAL]
neurosurgery 05/09/2020.

CLINICAL DATA: 45-year-old male with lumbar degenerative disc
disease.

EXAM:
MRI LUMBAR SPINE WITHOUT CONTRAST
TECHNIQUE: Multiplanar, multisequence MR imaging of the lumbar spine was
performed. No intravenous contrast was administered.

[Series 5: T2 · sagittal · 4.0mm · 0.68mm/px · 7 of 15 slices shown (1 of 2)]
[im 1/15]
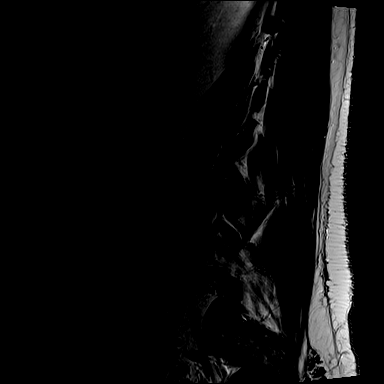
[im 3/15]
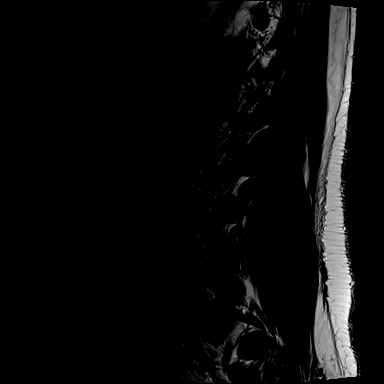
[im 5/15]
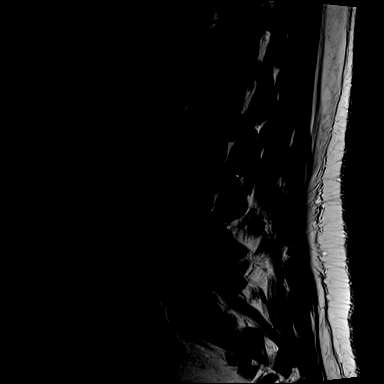
[im 8/15]
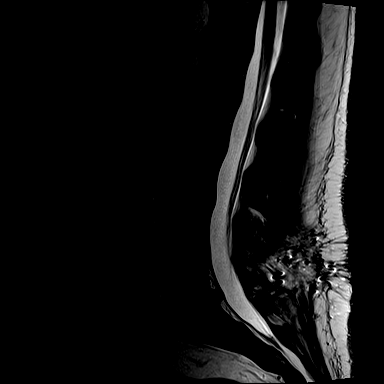
[im 10/15]
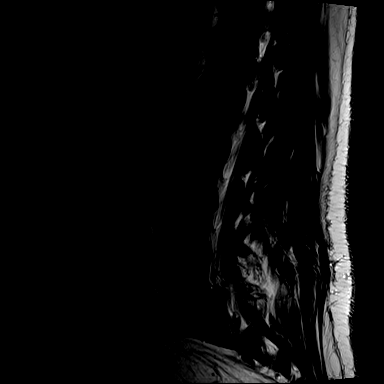
[im 12/15]
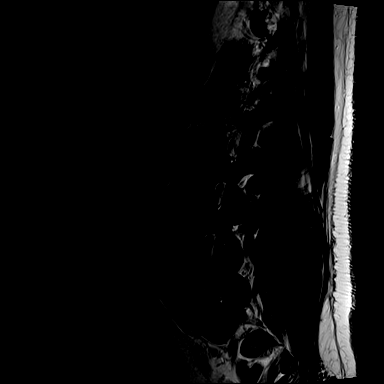
[im 15/15]
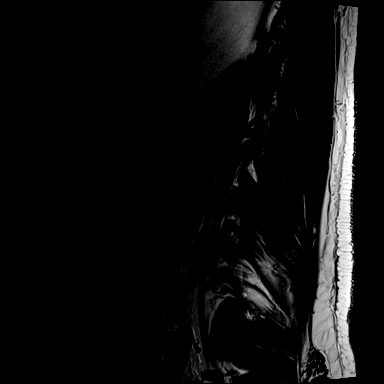

[Series 6: T1 · sagittal · 4.0mm · 0.81mm/px · 7 of 15 slices shown (1 of 2)]
[im 1/15]
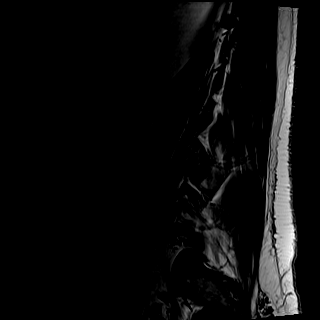
[im 3/15]
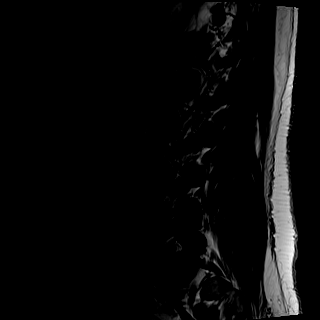
[im 5/15]
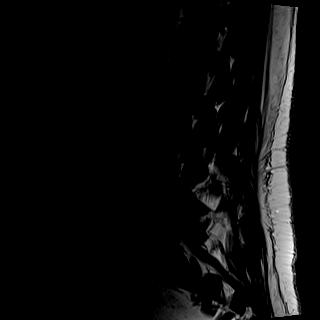
[im 8/15]
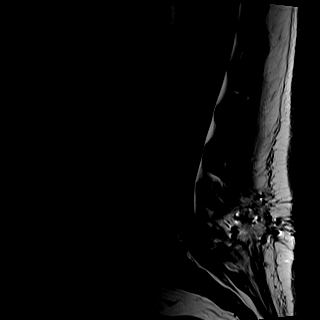
[im 10/15]
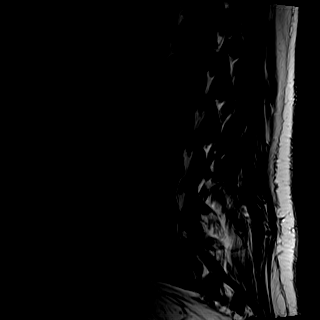
[im 12/15]
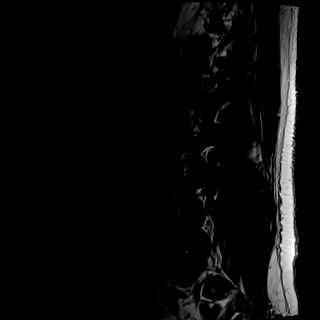
[im 15/15]
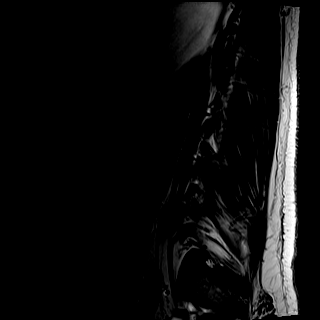

[Series 7: STIR · sagittal · 4.0mm · 0.51mm/px · 1 of 15 slices shown]
[im 1/15]
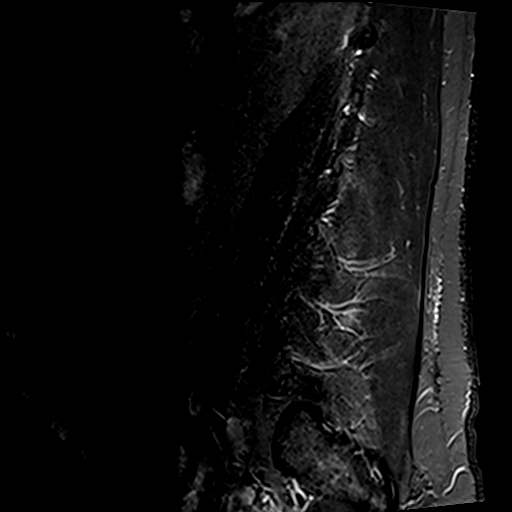

[Series 8: T2 · axial · 4.0mm · 0.70mm/px · z∈[-86,+106]mm · 8 of 33 slices shown (2 of 2)]
[im 1/33]
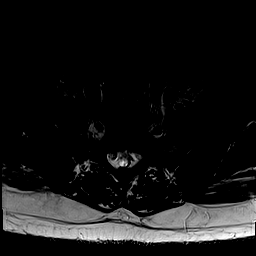
[im 5/33]
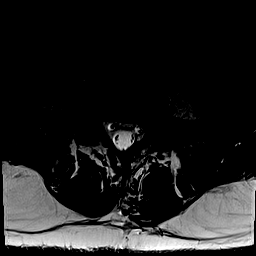
[im 10/33]
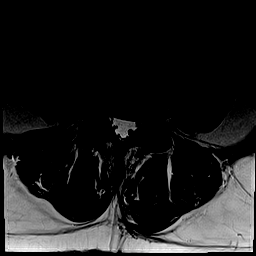
[im 15/33]
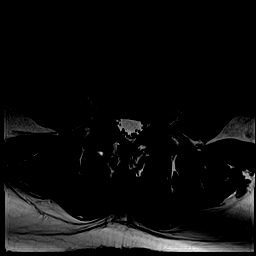
[im 18/33]
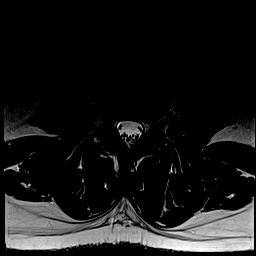
[im 23/33]
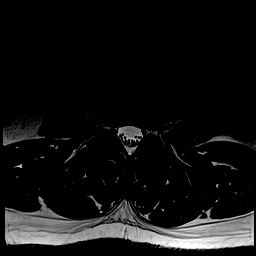
[im 28/33]
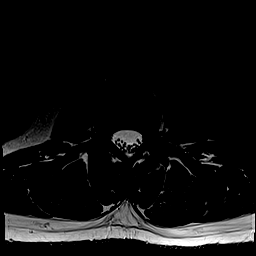
[im 33/33]
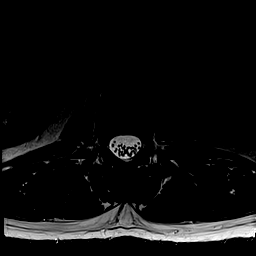

[Series 9: T1 · axial · 4.0mm · 0.35mm/px · z∈[-86,+106]mm · 8 of 33 slices shown (2 of 2)]
[im 1/33]
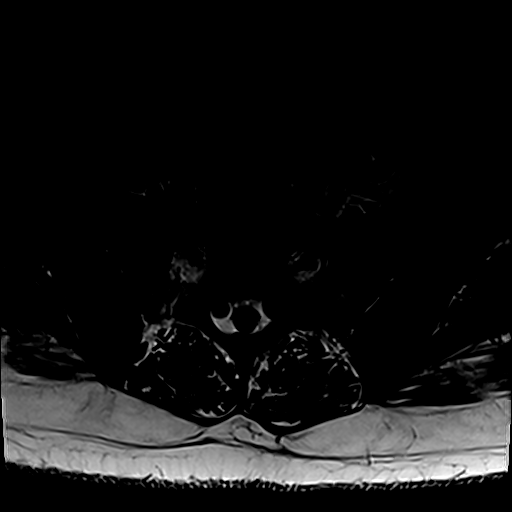
[im 5/33]
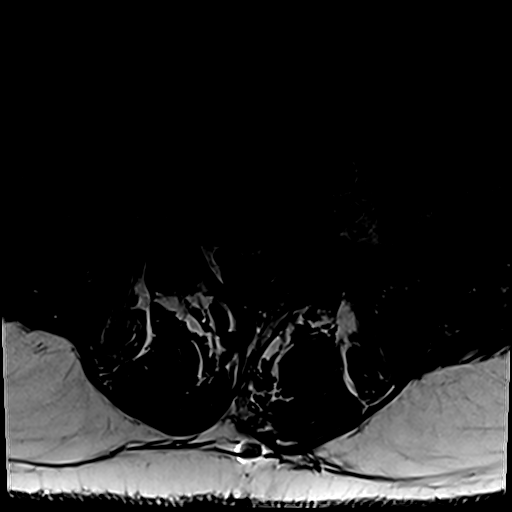
[im 10/33]
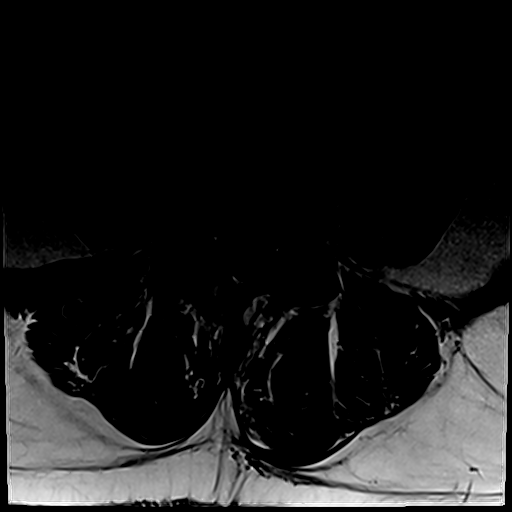
[im 15/33]
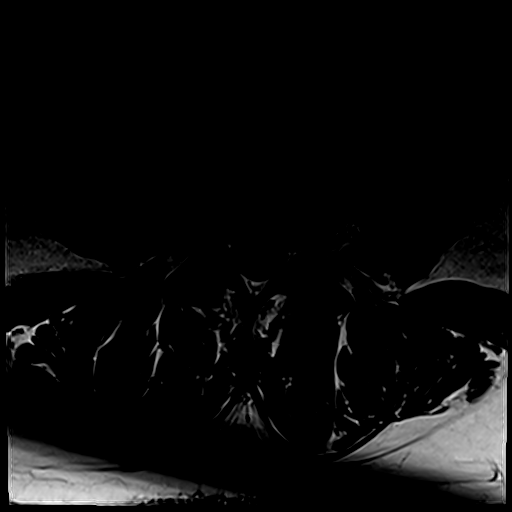
[im 18/33]
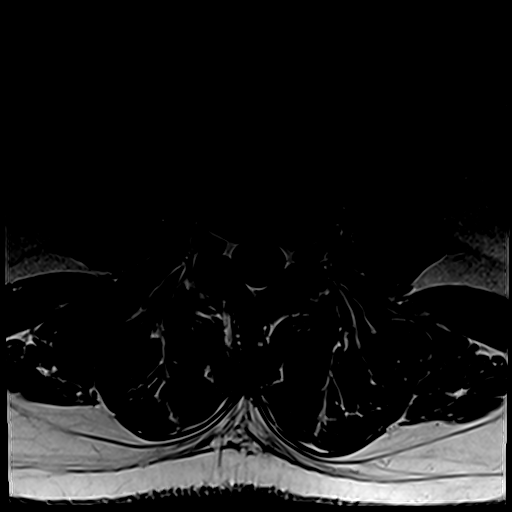
[im 23/33]
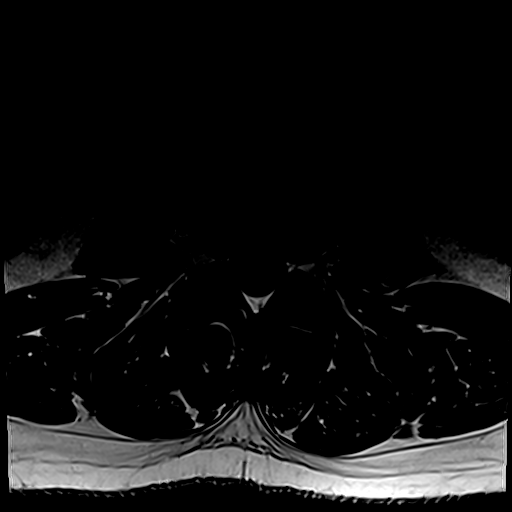
[im 28/33]
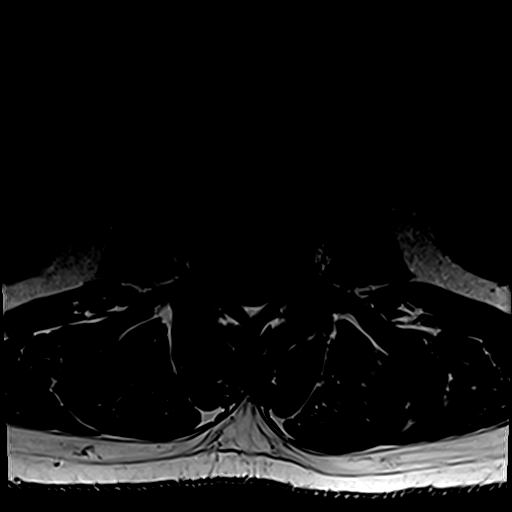
[im 33/33]
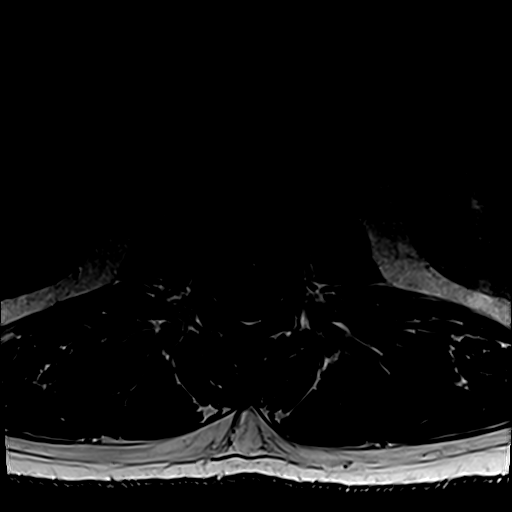

[31 of 48 positions shown; findings below may reference images not displayed]

FINDINGS: Segmentation: Normal on the prior radiographs which is the same
numbering system used on the 0602 MRI.

Alignment: Stable lumbar lordosis and mild levoconvex lumbar
scoliosis since 0602. No significant spondylolisthesis.

Vertebrae: Chronic mild nonspecific generalized decreased marrow
signal in the visible spine and pelvis. But No marrow edema or
evidence of acute osseous abnormality. Intact visible sacrum.

Conus medullaris and cauda equina: Conus extends to the T12-L1
level. No lower spinal cord or conus signal abnormality. Capacious
spinal canal. Normal cauda equina nerve roots.

Paraspinal and other soft tissues: Mild chronic postoperative
changes to the left paraspinal soft tissues at L5-S1, stable.
Negative visible abdominal viscera.

Disc levels:

Visible lower thoracic levels through L3-L4 remain negative.

L4-L5: Subtle disc bulging and endplate spurring. Mild facet and
ligament flavum hypertrophy. No spinal or lateral recess stenosis.
Borderline to mild L4 foraminal stenosis appears stable and greater
on the right.

L5-S1: Chronic left laminectomy (series 8 image 27). Disc
desiccation. Moderate size broad-base left paracentral disc
osteophyte complex. Associated midline disc bulge or protrusion
also. Mild residual facet hypertrophy. But capacious spinal canal.
No spinal stenosis or convincing neural impingement. Mild left L5
foraminal stenosis appears stable.
IMPRESSION: 1. Stable MRI appearance of the Lumbar spine since [DATE]. Previous left laminectomy at L5-S1 with moderate sized,
broad-based, leftward disc osteophyte complex. Most affecting the
left foramen. Capacious underlying spinal canal. No spinal or
lateral recess stenosis. Stable mild left L5 neural foraminal
stenosis.

3. Minor disc degeneration at L4-L5 with mild to moderate posterior
element hypertrophy. Chronic borderline to mild L4 neural foraminal
stenosis greater on the right.

## 2023-09-25 ENCOUNTER — Other Ambulatory Visit (HOSPITAL_BASED_OUTPATIENT_CLINIC_OR_DEPARTMENT_OTHER): Payer: Self-pay

## 2023-10-23 ENCOUNTER — Other Ambulatory Visit (HOSPITAL_BASED_OUTPATIENT_CLINIC_OR_DEPARTMENT_OTHER): Payer: Self-pay

## 2023-10-24 ENCOUNTER — Other Ambulatory Visit (HOSPITAL_BASED_OUTPATIENT_CLINIC_OR_DEPARTMENT_OTHER): Payer: Self-pay

## 2023-11-23 ENCOUNTER — Other Ambulatory Visit (HOSPITAL_BASED_OUTPATIENT_CLINIC_OR_DEPARTMENT_OTHER): Payer: Self-pay

## 2023-11-23 MED ORDER — BACLOFEN 10 MG PO TABS
10.0000 mg | ORAL_TABLET | Freq: Three times a day (TID) | ORAL | 11 refills | Status: DC
  Filled 2023-11-23: qty 90, 30d supply, fill #0
  Filled 2023-12-21: qty 90, 30d supply, fill #1
  Filled 2024-01-20 (×2): qty 90, 30d supply, fill #2
  Filled 2024-02-19: qty 90, 30d supply, fill #3
  Filled 2024-03-21: qty 90, 30d supply, fill #4
  Filled 2024-04-19 (×2): qty 90, 30d supply, fill #5
  Filled 2024-05-19: qty 90, 30d supply, fill #6
  Filled 2024-06-16: qty 90, 30d supply, fill #7
  Filled 2024-07-18: qty 90, 30d supply, fill #8
  Filled 2024-08-15: qty 90, 30d supply, fill #9
  Filled 2024-09-15: qty 90, 30d supply, fill #10
  Filled 2024-10-13: qty 90, 30d supply, fill #11

## 2023-12-21 ENCOUNTER — Other Ambulatory Visit (HOSPITAL_BASED_OUTPATIENT_CLINIC_OR_DEPARTMENT_OTHER): Payer: Self-pay

## 2023-12-21 MED ORDER — OXYCODONE HCL 10 MG PO TABS
ORAL_TABLET | ORAL | 0 refills | Status: AC
  Filled 2023-12-21: qty 55, 14d supply, fill #0
  Filled 2023-12-21: qty 65, 16d supply, fill #0
  Filled 2024-02-19: qty 120, 30d supply, fill #0

## 2023-12-21 MED ORDER — OXYCODONE HCL 10 MG PO TABS
10.0000 mg | ORAL_TABLET | Freq: Four times a day (QID) | ORAL | 0 refills | Status: AC
  Filled 2024-01-20: qty 120, 30d supply, fill #0

## 2023-12-21 MED ORDER — OXYCODONE HCL 10 MG PO TABS
10.0000 mg | ORAL_TABLET | Freq: Four times a day (QID) | ORAL | 0 refills | Status: AC
  Filled 2023-12-21: qty 65, 17d supply, fill #0
  Filled 2023-12-21: qty 55, 13d supply, fill #0

## 2024-01-20 ENCOUNTER — Other Ambulatory Visit (HOSPITAL_BASED_OUTPATIENT_CLINIC_OR_DEPARTMENT_OTHER): Payer: Self-pay

## 2024-02-19 ENCOUNTER — Other Ambulatory Visit (HOSPITAL_BASED_OUTPATIENT_CLINIC_OR_DEPARTMENT_OTHER): Payer: Self-pay

## 2024-02-20 ENCOUNTER — Other Ambulatory Visit (HOSPITAL_BASED_OUTPATIENT_CLINIC_OR_DEPARTMENT_OTHER): Payer: Self-pay

## 2024-03-19 ENCOUNTER — Other Ambulatory Visit (HOSPITAL_BASED_OUTPATIENT_CLINIC_OR_DEPARTMENT_OTHER): Payer: Self-pay

## 2024-03-21 ENCOUNTER — Other Ambulatory Visit (HOSPITAL_BASED_OUTPATIENT_CLINIC_OR_DEPARTMENT_OTHER): Payer: Self-pay

## 2024-03-21 MED ORDER — OXYCODONE HCL 10 MG PO TABS
10.0000 mg | ORAL_TABLET | Freq: Four times a day (QID) | ORAL | 0 refills | Status: AC
  Filled 2024-03-21: qty 120, 30d supply, fill #0

## 2024-03-21 MED ORDER — OXYCODONE HCL 10 MG PO TABS
10.0000 mg | ORAL_TABLET | Freq: Four times a day (QID) | ORAL | 0 refills | Status: AC
  Filled 2024-05-19: qty 92, 23d supply, fill #0
  Filled 2024-05-19: qty 29, 8d supply, fill #0
  Filled 2024-05-19: qty 28, 7d supply, fill #0

## 2024-03-21 MED ORDER — OXYCODONE HCL 10 MG PO TABS
10.0000 mg | ORAL_TABLET | Freq: Four times a day (QID) | ORAL | 0 refills | Status: AC
Start: 2024-04-20 — End: ?
  Filled 2024-04-20: qty 120, 30d supply, fill #0

## 2024-04-19 ENCOUNTER — Other Ambulatory Visit (HOSPITAL_BASED_OUTPATIENT_CLINIC_OR_DEPARTMENT_OTHER): Payer: Self-pay

## 2024-04-20 ENCOUNTER — Other Ambulatory Visit (HOSPITAL_BASED_OUTPATIENT_CLINIC_OR_DEPARTMENT_OTHER): Payer: Self-pay

## 2024-05-19 ENCOUNTER — Other Ambulatory Visit (HOSPITAL_COMMUNITY): Payer: Self-pay

## 2024-05-19 ENCOUNTER — Other Ambulatory Visit: Payer: Self-pay

## 2024-05-19 ENCOUNTER — Other Ambulatory Visit (HOSPITAL_BASED_OUTPATIENT_CLINIC_OR_DEPARTMENT_OTHER): Payer: Self-pay

## 2024-05-20 ENCOUNTER — Other Ambulatory Visit (HOSPITAL_BASED_OUTPATIENT_CLINIC_OR_DEPARTMENT_OTHER): Payer: Self-pay

## 2024-06-14 ENCOUNTER — Other Ambulatory Visit (HOSPITAL_BASED_OUTPATIENT_CLINIC_OR_DEPARTMENT_OTHER): Payer: Self-pay

## 2024-06-14 MED ORDER — OXYCODONE HCL 10 MG PO TABS
10.0000 mg | ORAL_TABLET | Freq: Four times a day (QID) | ORAL | 0 refills | Status: DC
  Filled 2024-08-15: qty 120, 30d supply, fill #0

## 2024-06-14 MED ORDER — OXYCODONE HCL 10 MG PO TABS
10.0000 mg | ORAL_TABLET | Freq: Four times a day (QID) | ORAL | 0 refills | Status: DC
  Filled 2024-07-18: qty 120, 30d supply, fill #0

## 2024-06-14 MED ORDER — OXYCODONE HCL 10 MG PO TABS
10.0000 mg | ORAL_TABLET | Freq: Four times a day (QID) | ORAL | 0 refills | Status: AC
  Filled 2024-06-17: qty 120, 30d supply, fill #0

## 2024-06-16 ENCOUNTER — Other Ambulatory Visit (HOSPITAL_BASED_OUTPATIENT_CLINIC_OR_DEPARTMENT_OTHER): Payer: Self-pay

## 2024-06-16 ENCOUNTER — Other Ambulatory Visit: Payer: Self-pay

## 2024-06-17 ENCOUNTER — Other Ambulatory Visit (HOSPITAL_BASED_OUTPATIENT_CLINIC_OR_DEPARTMENT_OTHER): Payer: Self-pay

## 2024-07-14 ENCOUNTER — Other Ambulatory Visit (HOSPITAL_BASED_OUTPATIENT_CLINIC_OR_DEPARTMENT_OTHER): Payer: Self-pay

## 2024-07-14 MED ORDER — OXYCODONE HCL 10 MG PO TABS
10.0000 mg | ORAL_TABLET | Freq: Four times a day (QID) | ORAL | 0 refills | Status: AC
  Filled 2024-07-18: qty 120, 30d supply, fill #0

## 2024-07-15 ENCOUNTER — Other Ambulatory Visit (HOSPITAL_BASED_OUTPATIENT_CLINIC_OR_DEPARTMENT_OTHER): Payer: Self-pay

## 2024-07-18 ENCOUNTER — Other Ambulatory Visit (HOSPITAL_BASED_OUTPATIENT_CLINIC_OR_DEPARTMENT_OTHER): Payer: Self-pay

## 2024-08-15 ENCOUNTER — Other Ambulatory Visit (HOSPITAL_BASED_OUTPATIENT_CLINIC_OR_DEPARTMENT_OTHER): Payer: Self-pay

## 2024-09-15 ENCOUNTER — Other Ambulatory Visit (HOSPITAL_BASED_OUTPATIENT_CLINIC_OR_DEPARTMENT_OTHER): Payer: Self-pay

## 2024-09-15 MED ORDER — OXYCODONE HCL 10 MG PO TABS
10.0000 mg | ORAL_TABLET | Freq: Four times a day (QID) | ORAL | 0 refills | Status: AC
  Filled 2024-11-14: qty 120, 30d supply, fill #0

## 2024-09-15 MED ORDER — INDOMETHACIN ER 75 MG PO CPCR
75.0000 mg | ORAL_CAPSULE | Freq: Two times a day (BID) | ORAL | 11 refills | Status: AC
  Filled 2024-09-15: qty 60, 30d supply, fill #0

## 2024-09-15 MED ORDER — OXYCODONE HCL 10 MG PO TABS: 10.0000 mg | ORAL_TABLET | Freq: Four times a day (QID) | ORAL | 0 refills | Status: DC | PRN

## 2024-09-15 MED ORDER — OXYCODONE HCL 10 MG PO TABS
10.0000 mg | ORAL_TABLET | Freq: Four times a day (QID) | ORAL | 0 refills | Status: AC
  Filled 2024-09-15: qty 120, 30d supply, fill #0

## 2024-10-13 ENCOUNTER — Other Ambulatory Visit (HOSPITAL_BASED_OUTPATIENT_CLINIC_OR_DEPARTMENT_OTHER): Payer: Self-pay

## 2024-10-13 ENCOUNTER — Other Ambulatory Visit: Payer: Self-pay

## 2024-10-13 MED ORDER — OXYCODONE HCL 10 MG PO TABS
10.0000 mg | ORAL_TABLET | Freq: Four times a day (QID) | ORAL | 0 refills | Status: DC
  Filled 2024-10-13: qty 120, 30d supply, fill #0

## 2024-11-14 ENCOUNTER — Other Ambulatory Visit (HOSPITAL_BASED_OUTPATIENT_CLINIC_OR_DEPARTMENT_OTHER): Payer: Self-pay

## 2024-11-14 ENCOUNTER — Other Ambulatory Visit: Payer: Self-pay

## 2024-11-14 MED ORDER — BACLOFEN 10 MG PO TABS
10.0000 mg | ORAL_TABLET | Freq: Three times a day (TID) | ORAL | 11 refills | Status: AC
  Filled 2024-11-14: qty 90, 30d supply, fill #0
  Filled 2024-12-13: qty 90, 30d supply, fill #1

## 2024-12-09 ENCOUNTER — Other Ambulatory Visit (HOSPITAL_BASED_OUTPATIENT_CLINIC_OR_DEPARTMENT_OTHER): Payer: Self-pay

## 2024-12-09 MED ORDER — OXYCODONE HCL 10 MG PO TABS
10.0000 mg | ORAL_TABLET | Freq: Four times a day (QID) | ORAL | 0 refills | Status: AC | PRN
  Filled 2024-12-14: qty 40, 10d supply, fill #0

## 2024-12-13 ENCOUNTER — Other Ambulatory Visit: Payer: Self-pay

## 2024-12-13 ENCOUNTER — Other Ambulatory Visit (HOSPITAL_BASED_OUTPATIENT_CLINIC_OR_DEPARTMENT_OTHER): Payer: Self-pay

## 2024-12-14 ENCOUNTER — Other Ambulatory Visit (HOSPITAL_BASED_OUTPATIENT_CLINIC_OR_DEPARTMENT_OTHER): Payer: Self-pay
